# Patient Record
Sex: Female | Born: 1985
Health system: Southern US, Community
[De-identification: ages and names within clinical notes are randomized; demographics above are authoritative.]

## PROBLEM LIST (undated history)

## (undated) DIAGNOSIS — J45909 Unspecified asthma, uncomplicated: Secondary | ICD-10-CM

## (undated) DIAGNOSIS — O409XX0 Polyhydramnios, unspecified trimester, not applicable or unspecified: Secondary | ICD-10-CM

## (undated) DIAGNOSIS — E669 Obesity, unspecified: Secondary | ICD-10-CM

## (undated) DIAGNOSIS — G43909 Migraine, unspecified, not intractable, without status migrainosus: Secondary | ICD-10-CM

## (undated) HISTORY — DX: Migraine, unspecified, not intractable, without status migrainosus: G43.909

## (undated) HISTORY — DX: Unspecified asthma, uncomplicated: J45.909

---

## 2015-02-10 HISTORY — PX: OVARIAN CYST REMOVAL: SHX89

## 2015-03-07 DIAGNOSIS — Z8669 Personal history of other diseases of the nervous system and sense organs: Secondary | ICD-10-CM | POA: Insufficient documentation

## 2016-02-10 HISTORY — PX: DILATION AND CURETTAGE OF UTERUS: SHX78

## 2017-11-16 ENCOUNTER — Ambulatory Visit (INDEPENDENT_AMBULATORY_CARE_PROVIDER_SITE_OTHER): Payer: BLUE CROSS/BLUE SHIELD | Admitting: Obstetrics and Gynecology

## 2017-11-16 ENCOUNTER — Encounter: Payer: Self-pay | Admitting: Obstetrics and Gynecology

## 2017-11-16 VITALS — BP 136/82 | HR 89 | Ht 65.0 in | Wt 242.0 lb

## 2017-11-16 DIAGNOSIS — Z3200 Encounter for pregnancy test, result unknown: Secondary | ICD-10-CM | POA: Diagnosis not present

## 2017-11-16 LAB — POCT URINE PREGNANCY: Preg Test, Ur: POSITIVE — AB

## 2017-11-16 NOTE — Progress Notes (Signed)
HPI:      [name] is a 32 y.o. G1P0 who LMP was Patient's last menstrual period was 10/05/2017 (exact date).  Subjective:   She presents today for pregnancy confirmation.  Her last menstrual period was in August.  She and her husband have been trying for almost a full year to become pregnant. Her last pregnancy ended in a miscarriage for anembryonic gestation. Patient's medical history significant for sickle cell trait.  She states her husband has been specifically tested and is negative for sickle cell disease or trait. Patient is currently taking prenatal vitamins.    Hx: The following portions of the patient's history were reviewed and updated as appropriate:             She  has a past medical history of Asthma and Migraines. She does not have a problem list on file. She  has a past surgical history that includes Ovarian cyst removal (2017) and Dilation and curettage of uterus (2018). Her family history includes Diabetes in her maternal grandmother; Hyperlipidemia in her father. She  reports that she has never smoked. She has never used smokeless tobacco. She reports that she does not drink alcohol or use drugs. She has a current medication list which includes the following prescription(s): prenatal multivitamin and sumatriptan. She has no allergies on file.       Review of Systems:  Review of Systems  Constitutional: Denied constitutional symptoms, night sweats, recent illness, fatigue, fever, insomnia and weight loss.  Eyes: Denied eye symptoms, eye pain, photophobia, vision change and visual disturbance.  Ears/Nose/Throat/Neck: Denied ear, nose, throat or neck symptoms, hearing loss, nasal discharge, sinus congestion and sore throat.  Cardiovascular: Denied cardiovascular symptoms, arrhythmia, chest pain/pressure, edema, exercise intolerance, orthopnea and palpitations.  Respiratory: Denied pulmonary symptoms, asthma, pleuritic pain, productive sputum, cough, dyspnea and  wheezing.  Gastrointestinal: Denied, gastro-esophageal reflux, melena, nausea and vomiting.  Genitourinary: Denied genitourinary symptoms including symptomatic vaginal discharge, pelvic relaxation issues, and urinary complaints.  Musculoskeletal: Denied musculoskeletal symptoms, stiffness, swelling, muscle weakness and myalgia.  Dermatologic: Denied dermatology symptoms, rash and scar.  Neurologic: Denied neurology symptoms, dizziness, headache, neck pain and syncope.  Psychiatric: Denied psychiatric symptoms, anxiety and depression.  Endocrine: Denied endocrine symptoms including hot flashes and night sweats.   Meds:   Current Outpatient Medications on File Prior to Visit  Medication Sig Dispense Refill  . Prenatal Vit-Fe Fumarate-FA (PRENATAL MULTIVITAMIN) TABS tablet Take 1 tablet by mouth daily at 12 noon.    . SUMAtriptan (IMITREX) 50 MG tablet Take 50 mg by mouth as needed for migraine. May repeat in 2 hours if headache persists or recurs.     No current facility-administered medications on file prior to visit.     Objective:     Vitals:   11/16/17 1353  BP: 136/82  Pulse: 89              UPC positive  Assessment:    G1P0 There are no active problems to display for this patient.    1. Possible pregnancy     Based on LMP approximately [redacted] weeks gestation.   Plan:              Prenatal Plan 1.  The patient was given prenatal literature. 2.  She was continued on prenatal vitamins. 3.  A prenatal lab panel was ordered or drawn. 4.  An ultrasound was ordered to better determine an EDC. 5.  A nurse visit was scheduled. 6.  Genetic  testing and testing for other inheritable conditions discussed in detail. She will decide in the future whether to have these labs performed. 7.  A general overview of pregnancy testing, visit schedule, ultrasound schedule, and prenatal care was discussed.   Orders Orders Placed This Encounter  Procedures  . US OB Comp Less 14 Wks  .  POCT urine pregnancy    No orders of the defined types were placed in this encounter.     F/U  Return in about 5 weeks (around 12/21/2017). I spent 15 minutes involved in the care of this patient of which greater than 50% was spent discussing pregnancy, prenatal care, genetic testing, sickle cell trait and disease, all questions answered.  Elonda Husky, M.D. 11/16/2017 2:35 PM

## 2017-11-16 NOTE — Progress Notes (Signed)
Pt presents today for pregnancy confirmation. LMP  10/05/17 Pt and her husband have been try to conceive for the past year since a miscarriage and D&C in June 2018.

## 2017-11-17 ENCOUNTER — Ambulatory Visit (INDEPENDENT_AMBULATORY_CARE_PROVIDER_SITE_OTHER): Payer: BLUE CROSS/BLUE SHIELD

## 2017-11-17 DIAGNOSIS — Z3A01 Less than 8 weeks gestation of pregnancy: Secondary | ICD-10-CM | POA: Diagnosis not present

## 2017-11-17 DIAGNOSIS — O3680X0 Pregnancy with inconclusive fetal viability, not applicable or unspecified: Secondary | ICD-10-CM | POA: Diagnosis not present

## 2017-11-17 DIAGNOSIS — Z3200 Encounter for pregnancy test, result unknown: Secondary | ICD-10-CM

## 2017-11-18 ENCOUNTER — Other Ambulatory Visit: Payer: Self-pay | Admitting: Obstetrics and Gynecology

## 2017-11-18 DIAGNOSIS — O3680X0 Pregnancy with inconclusive fetal viability, not applicable or unspecified: Secondary | ICD-10-CM

## 2017-12-01 ENCOUNTER — Ambulatory Visit (INDEPENDENT_AMBULATORY_CARE_PROVIDER_SITE_OTHER): Payer: BLUE CROSS/BLUE SHIELD

## 2017-12-01 DIAGNOSIS — O3680X0 Pregnancy with inconclusive fetal viability, not applicable or unspecified: Secondary | ICD-10-CM | POA: Diagnosis not present

## 2017-12-01 DIAGNOSIS — Z3A01 Less than 8 weeks gestation of pregnancy: Secondary | ICD-10-CM

## 2017-12-01 DIAGNOSIS — N8312 Corpus luteum cyst of left ovary: Secondary | ICD-10-CM | POA: Diagnosis not present

## 2017-12-01 DIAGNOSIS — O3411 Maternal care for benign tumor of corpus uteri, first trimester: Secondary | ICD-10-CM | POA: Diagnosis not present

## 2017-12-01 NOTE — Progress Notes (Unsigned)
      Lurline Hare presents for NOB nurse intake visit. Pregnancy confirmation done at [facility] 11/16/17 with DJE.  G1 P 0 LMP 10/05/2017. 12/01/17 U/S 7/6 EDD 07/14/18  Pregnancy education material explained and given. 0 cats in the home.  NOB labs ordered. BMI greater than 30. TSH/HbgA1c. Sickle cell order due to race. HIV and drug screen explained and ordered. Genetic screening discussed.  Do to increased BMI patient will have at next visit. PNV encouraged. Pt to follow up with provider in 4 weeks for NOB physical. [employer] and [employer] reviewed and explained to pt. Pt stated that she understood.   BP 131/68   Pulse 94   Ht 5\' 5"  (1.651 m)   Wt 239 lb 4.8 oz (108.5 kg)   LMP 10/05/2017 (Exact Date)   BMI 39.82 kg/m

## 2017-12-03 ENCOUNTER — Ambulatory Visit: Payer: BLUE CROSS/BLUE SHIELD | Admitting: Obstetrics and Gynecology

## 2017-12-03 VITALS — BP 131/68 | HR 94 | Ht 65.0 in | Wt 239.3 lb

## 2017-12-03 DIAGNOSIS — Z0283 Encounter for blood-alcohol and blood-drug test: Secondary | ICD-10-CM

## 2017-12-03 DIAGNOSIS — Z113 Encounter for screening for infections with a predominantly sexual mode of transmission: Secondary | ICD-10-CM

## 2017-12-03 DIAGNOSIS — Z3401 Encounter for supervision of normal first pregnancy, first trimester: Secondary | ICD-10-CM

## 2017-12-03 NOTE — Patient Instructions (Signed)
How a Baby Grows During Pregnancy Pregnancy begins when a female's sperm enters a female's egg (fertilization). This happens in one of the tubes (fallopian tubes) that connect the ovaries to the womb (uterus). The fertilized egg is called an embryo until it reaches 10 weeks. From 10 weeks until birth, it is called a fetus. The fertilized egg moves down the fallopian tube to the uterus. Then it implants into the lining of the uterus and begins to grow. The developing fetus receives oxygen and nutrients through the pregnant woman's bloodstream and the tissues that grow (placenta) to support the fetus. The placenta is the life support system for the fetus. It provides nutrition and removes waste. Learning as much as you can about your pregnancy and how your baby is developing can help you enjoy the experience. It can also make you aware of when there might be a problem and when to ask questions. How long does a typical pregnancy last? A pregnancy usually lasts 280 days, or about 40 weeks. Pregnancy is divided into three trimesters:  First trimester: 0-13 weeks.  Second trimester: 14-27 weeks.  Third trimester: 28-40 weeks.  The day when your baby is considered ready to be born (full term) is your estimated date of delivery. How does my baby develop month by month? First month  The fertilized egg attaches to the inside of the uterus.  Some cells will form the placenta. Others will form the fetus.  The arms, legs, brain, spinal cord, lungs, and heart begin to develop.  At the end of the first month, the heart begins to beat.  Second month  The bones, inner ear, eyelids, hands, and feet form.  The genitals develop.  By the end of 8 weeks, all major organs are developing.  Third month  All of the internal organs are forming.  Teeth develop below the gums.  Bones and muscles begin to grow. The spine can flex.  The skin is transparent.  Fingernails and toenails begin to form.  Arms  and legs continue to grow longer, and hands and feet develop.  The fetus is about 3 in (7.6 cm) long.  Fourth month  The placenta is completely formed.  The external sex organs, neck, outer ear, eyebrows, eyelids, and fingernails are formed.  The fetus can hear, swallow, and move its arms and legs.  The kidneys begin to produce urine.  The skin is covered with a white waxy coating (vernix) and very fine hair (lanugo).  Fifth month  The fetus moves around more and can be felt for the first time (quickening).  The fetus starts to sleep and wake up and may begin to suck its finger.  The nails grow to the end of the fingers.  The organ in the digestive system that makes bile (gallbladder) functions and helps to digest the nutrients.  If your baby is a girl, eggs are present in her ovaries. If your baby is a boy, testicles start to move down into his scrotum.  Sixth month  The lungs are formed, but the fetus is not yet able to breathe.  The eyes open. The brain continues to develop.  Your baby has fingerprints and toe prints. Your baby's hair grows thicker.  At the end of the second trimester, the fetus is about 9 in (22.9 cm) long.  Seventh month  The fetus kicks and stretches.  The eyes are developed enough to sense changes in light.  The hands can make a grasping motion.  The   fetus responds to sound.  Eighth month  All organs and body systems are fully developed and functioning.  Bones harden and taste buds develop. The fetus may hiccup.  Certain areas of the brain are still developing. The skull remains soft.  Ninth month  The fetus gains about  lb (0.23 kg) each week.  The lungs are fully developed.  Patterns of sleep develop.  The fetus's head typically moves into a head-down position (vertex) in the uterus to prepare for birth. If the buttocks move into a vertex position instead, the baby is breech.  The fetus weighs 6-9 lbs (2.72-4.08 kg) and is  19-20 in (48.26-50.8 cm) long.  What can I do to have a healthy pregnancy and help my baby develop? Eating and Drinking  Eat a healthy diet. ? Talk with your health care provider to make sure that you are getting the nutrients that you and your baby need. ? Visit www.choosemyplate.gov to learn about creating a healthy diet.  Gain a healthy amount of weight during pregnancy as advised by your health care provider. This is usually 25-35 pounds. You may need to: ? Gain more if you were underweight before getting pregnant or if you are pregnant with more than one baby. ? Gain less if you were overweight or obese when you got pregnant.  Medicines and Vitamins  Take prenatal vitamins as directed by your health care provider. These include vitamins such as folic acid, iron, calcium, and vitamin D. They are important for healthy development.  Take medicines only as directed by your health care provider. Read labels and ask a pharmacist or your health care provider whether over-the-counter medicines, supplements, and prescription drugs are safe to take during pregnancy.  Activities  Be physically active as advised by your health care provider. Ask your health care provider to recommend activities that are safe for you to do, such as walking or swimming.  Do not participate in strenuous or extreme sports.  Lifestyle  Do not drink alcohol.  Do not use any tobacco products, including cigarettes, chewing tobacco, or electronic cigarettes. If you need help quitting, ask your health care provider.  Do not use illegal drugs.  Safety  Avoid exposure to mercury, lead, or other heavy metals. Ask your health care provider about common sources of these heavy metals.  Avoid listeria infection during pregnancy. Follow these precautions: ? Do not eat soft cheeses or deli meats. ? Do not eat hot dogs unless they have been warmed up to the point of steaming, such as in the microwave oven. ? Do not  drink unpasteurized milk.  Avoid toxoplasmosis infection during pregnancy. Follow these precautions: ? Do not change your cat's litter box, if you have a cat. Ask someone else to do this for you. ? Wear gardening gloves while working in the yard.  General Instructions  Keep all follow-up visits as directed by your health care provider. This is important. This includes prenatal care and screening tests.  Manage any chronic health conditions. Work closely with your health care provider to keep conditions, such as diabetes, under control.  How do I know if my baby is developing well? At each prenatal visit, your health care provider will do several different tests to check on your health and keep track of your baby's development. These include:  Fundal height. ? Your health care provider will measure your growing belly from top to bottom using a tape measure. ? Your health care provider will also feel your belly   to determine your baby's position.  Heartbeat. ? An ultrasound in the first trimester can confirm pregnancy and show a heartbeat, depending on how far along you are. ? Your health care provider will check your baby's heart rate at every prenatal visit. ? As you get closer to your delivery date, you may have regular fetal heart rate monitoring to make sure that your baby is not in distress.  Second trimester ultrasound. ? This ultrasound checks your baby's development. It also indicates your baby's gender.  What should I do if I have concerns about my baby's development? Always talk with your health care provider about any concerns that you may have. This information is not intended to replace advice given to you by your health care provider. Make sure you discuss any questions you have with your health care provider. Document Released: 07/15/2007 Document Revised: 07/04/2015 Document Reviewed: 07/05/2013 Elsevier Interactive Patient Education  2018 [employer]  First  Trimester of Pregnancy The first trimester of pregnancy is from week 1 until the end of week 13 (months 1 through 3). A week after a sperm fertilizes an egg, the egg will implant on the wall of the uterus. This embryo will begin to develop into a baby. Genes from you and your partner will form the baby. The female genes will determine whether the baby will be a boy or a girl. At 6-8 weeks, the eyes and face will be formed, and the heartbeat can be seen on ultrasound. At the end of 12 weeks, all the baby's organs will be formed. Now that you are pregnant, you will want to do everything you can to have a healthy baby. Two of the most important things are to get good prenatal care and to follow your health care provider's instructions. Prenatal care is all the medical care you receive before the baby's birth. This care will help prevent, find, and treat any problems during the pregnancy and childbirth. Body changes during your first trimester Your body goes through many changes during pregnancy. The changes vary from woman to woman.  You may gain or lose a couple of pounds at first.  You may feel sick to your stomach (nauseous) and you may throw up (vomit). If the vomiting is uncontrollable, call your health care provider.  You may tire easily.  You may develop headaches that can be relieved by medicines. All medicines should be approved by your health care provider.  You may urinate more often. Painful urination may mean you have a bladder infection.  You may develop heartburn as a result of your pregnancy.  You may develop constipation because certain hormones are causing the muscles that push stool through your intestines to slow down.  You may develop hemorrhoids or swollen veins (varicose veins).  Your breasts may begin to grow larger and become tender. Your nipples may stick out more, and the tissue that surrounds them (areola) may become darker.  Your gums may bleed and may be sensitive to  brushing and flossing.  Dark spots or blotches (chloasma, mask of pregnancy) may develop on your face. This will likely fade after the baby is born.  Your menstrual periods will stop.  You may have a loss of appetite.  You may develop cravings for certain kinds of food.  You may have changes in your emotions from day to day, such as being excited to be pregnant or being concerned that something may go wrong with the pregnancy and baby.  You may have more   vivid and strange dreams.  You may have changes in your hair. These can include thickening of your hair, rapid growth, and changes in texture. Some women also have hair loss during or after pregnancy, or hair that feels dry or thin. Your hair will most likely return to normal after your baby is born.  What to expect at prenatal visits During a routine prenatal visit:  You will be weighed to make sure you and the baby are growing normally.  Your blood pressure will be taken.  Your abdomen will be measured to track your baby's growth.  The fetal heartbeat will be listened to between weeks 10 and 14 of your pregnancy.  Test results from any previous visits will be discussed.  Your health care provider may ask you:  How you are feeling.  If you are feeling the baby move.  If you have had any abnormal symptoms, such as leaking fluid, bleeding, severe headaches, or abdominal cramping.  If you are using any tobacco products, including cigarettes, chewing tobacco, and electronic cigarettes.  If you have any questions.  Other tests that may be performed during your first trimester include:  Blood tests to find your blood type and to check for the presence of any previous infections. The tests will also be used to check for low iron levels (anemia) and protein on red blood cells (Rh antibodies). Depending on your risk factors, or if you previously had diabetes during pregnancy, you may have tests to check for high blood sugar that  affects pregnant women (gestational diabetes).  Urine tests to check for infections, diabetes, or protein in the urine.  An ultrasound to confirm the proper growth and development of the baby.  Fetal screens for spinal cord problems (spina bifida) and Down syndrome.  HIV (human immunodeficiency virus) testing. Routine prenatal testing includes screening for HIV, unless you choose not to have this test.  You may need other tests to make sure you and the baby are doing well.  Follow these instructions at home: Medicines  Follow your health care provider's instructions regarding medicine use. Specific medicines may be either safe or unsafe to take during pregnancy.  Take a prenatal vitamin that contains at least 600 micrograms (mcg) of folic acid.  If you develop constipation, try taking a stool softener if your health care provider approves. Eating and drinking  Eat a balanced diet that includes fresh fruits and vegetables, whole grains, good sources of protein such as meat, eggs, or tofu, and low-fat dairy. Your health care provider will help you determine the amount of weight gain that is right for you.  Avoid raw meat and uncooked cheese. These carry germs that can cause birth defects in the baby.  Eating four or five small meals rather than three large meals a day may help relieve nausea and vomiting. If you start to feel nauseous, eating a few soda crackers can be helpful. Drinking liquids between meals, instead of during meals, also seems to help ease nausea and vomiting.  Limit foods that are high in fat and processed sugars, such as fried and sweet foods.  To prevent constipation: ? Eat foods that are high in fiber, such as fresh fruits and vegetables, whole grains, and beans. ? Drink enough fluid to keep your urine clear or pale yellow. Activity  Exercise only as directed by your health care provider. Most women can continue their usual exercise routine during pregnancy. Try  to exercise for 30 minutes at least 5 days   a week. Exercising will help you: ? Control your weight. ? Stay in shape. ? Be prepared for labor and delivery.  Experiencing pain or cramping in the lower abdomen or lower back is a good sign that you should stop exercising. Check with your health care provider before continuing with normal exercises.  Try to avoid standing for long periods of time. Move your legs often if you must stand in one place for a long time.  Avoid heavy lifting.  Wear low-heeled shoes and practice good posture.  You may continue to have sex unless your health care provider tells you not to. Relieving pain and discomfort  Wear a good support bra to relieve breast tenderness.  Take warm sitz baths to soothe any pain or discomfort caused by hemorrhoids. Use hemorrhoid cream if your health care provider approves.  Rest with your legs elevated if you have leg cramps or low back pain.  If you develop varicose veins in your legs, wear support hose. Elevate your feet for 15 minutes, 3-4 times a day. Limit salt in your diet. Prenatal care  Schedule your prenatal visits by the twelfth week of pregnancy. They are usually scheduled monthly at first, then more often in the last 2 months before delivery.  Write down your questions. Take them to your prenatal visits.  Keep all your prenatal visits as told by your health care provider. This is important. Safety  Wear your seat belt at all times when driving.  Make a list of emergency phone numbers, including numbers for family, friends, the hospital, and police and fire departments. General instructions  Ask your health care provider for a referral to a local prenatal education class. Begin classes no later than the beginning of month 6 of your pregnancy.  Ask for help if you have counseling or nutritional needs during pregnancy. Your health care provider can offer advice or refer you to specialists for help with various  needs.  Do not use hot tubs, steam rooms, or saunas.  Do not douche or use tampons or scented sanitary pads.  Do not cross your legs for long periods of time.  Avoid cat litter boxes and soil used by cats. These carry germs that can cause birth defects in the baby and possibly loss of the fetus by miscarriage or stillbirth.  Avoid all smoking, herbs, alcohol, and medicines not prescribed by your health care provider. Chemicals in these products affect the formation and growth of the baby.  Do not use any products that contain nicotine or tobacco, such as cigarettes and e-cigarettes. If you need help quitting, ask your health care provider. You may receive counseling support and other resources to help you quit.  Schedule a dentist appointment. At home, brush your teeth with a soft toothbrush and be gentle when you floss. Contact a health care provider if:  You have dizziness.  You have mild pelvic cramps, pelvic pressure, or nagging pain in the abdominal area.  You have persistent nausea, vomiting, or diarrhea.  You have a bad smelling vaginal discharge.  You have pain when you urinate.  You notice increased swelling in your face, hands, legs, or ankles.  You are exposed to fifth disease or chickenpox.  You are exposed to German measles (rubella) and have never had it. Get help right away if:  You have a fever.  You are leaking fluid from your vagina.  You have spotting or bleeding from your vagina.  You have severe abdominal cramping or pain.    You have rapid weight gain or loss.  You vomit blood or material that looks like coffee grounds.  You develop a severe headache.  You have shortness of breath.  You have any kind of trauma, such as from a fall or a car accident. Summary  The first trimester of pregnancy is from week 1 until the end of week 13 (months 1 through 3).  Your body goes through many changes during pregnancy. The changes vary from woman to  woman.  You will have routine prenatal visits. During those visits, your health care provider will examine you, discuss any test results you may have, and talk with you about how you are feeling. This information is not intended to replace advice given to you by your health care provider. Make sure you discuss any questions you have with your health care provider. Document Released: 01/20/2001 Document Revised: 01/08/2016 Document Reviewed: 01/08/2016 Elsevier Interactive Patient Education  2018 [employer]  Commonly Asked Questions During Pregnancy  Cats: A parasite can be excreted in cat feces.  To avoid exposure you need to have another person empty the little box.  If you must empty the litter box you will need to wear gloves.  Wash your hands after handling your cat.  This parasite can also be found in raw or undercooked meat so this should also be avoided.  Colds, Sore Throats, Flu: Please check your medication sheet to see what you can take for symptoms.  If your symptoms are unrelieved by these medications please call the office.  Dental Work: Most any dental work your dentist recommends is permitted.  X-rays should only be taken during the first trimester if absolutely necessary.  Your abdomen should be shielded with a lead apron during all x-rays.  Please notify your provider prior to receiving any x-rays.  Novocaine is fine; gas is not recommended.  If your dentist requires a note from us prior to dental work please call the office and we will provide one for you.  Exercise: Exercise is an important part of staying healthy during your pregnancy.  You may continue most exercises you were accustomed to prior to pregnancy.  Later in your pregnancy you will most likely notice you have difficulty with activities requiring balance like riding a bicycle.  It is important that you listen to your body and avoid activities that put you at a higher risk of falling.  Adequate rest and staying well  hydrated are a must!  If you have questions about the safety of specific activities ask your provider.    Exposure to Children with illness: Try to avoid obvious exposure; report any symptoms to us when noted,  If you have chicken pos, red measles or mumps, you should be immune to these diseases.   Please do not take any vaccines while pregnant unless you have checked with your OB provider.  Fetal Movement: After 28 weeks we recommend you do "kick counts" twice daily.  Lie or sit down in a calm quiet environment and count your baby movements "kicks".  You should feel your baby at least 10 times per hour.  If you have not felt 10 kicks within the first hour get up, walk around and have something sweet to eat or drink then repeat for an additional hour.  If count remains less than 10 per hour notify your provider.  Fumigating: Follow your pest control agent's advice as to how long to stay out of your home.  Ventilate the area well   before re-entering.  Hemorrhoids:   Most over-the-counter preparations can be used during pregnancy.  Check your medication to see what is safe to use.  It is important to use a stool softener or fiber in your diet and to drink lots of liquids.  If hemorrhoids seem to be getting worse please call the office.   Hot Tubs:  Hot tubs Jacuzzis and saunas are not recommended while pregnant.  These increase your internal body temperature and should be avoided.  Intercourse:  Sexual intercourse is safe during pregnancy as long as you are comfortable, unless otherwise advised by your provider.  Spotting may occur after intercourse; report any bright red bleeding that is heavier than spotting.  Labor:  If you know that you are in labor, please go to the hospital.  If you are unsure, please call the office and let us help you decide what to do.  Lifting, straining, etc:  If your job requires heavy lifting or straining please check with your provider for any limitations.  Generally, you  should not lift items heavier than that you can lift simply with your hands and arms (no back muscles)  Painting:  Paint fumes do not harm your pregnancy, but may make you ill and should be avoided if possible.  Latex or water based paints have less odor than oils.  Use adequate ventilation while painting.  Permanents & Hair Color:  Chemicals in hair dyes are not recommended as they cause increase hair dryness which can increase hair loss during pregnancy.  " Highlighting" and permanents are allowed.  Dye may be absorbed differently and permanents may not hold as well during pregnancy.  Sunbathing:  Use a sunscreen, as skin burns easily during pregnancy.  Drink plenty of fluids; avoid over heating.  Tanning Beds:  Because their possible side effects are still unknown, tanning beds are not recommended.  Ultrasound Scans:  Routine ultrasounds are performed at approximately 20 weeks.  You will be able to see your baby's general anatomy an if you would like to know the gender this can usually be determined as well.  If it is questionable when you conceived you may also receive an ultrasound early in your pregnancy for dating purposes.  Otherwise ultrasound exams are not routinely performed unless there is a medical necessity.  Although you can request a scan we ask that you pay for it when conducted because insurance does not cover " patient request" scans.  Work: If your pregnancy proceeds without complications you may work until your due date, unless your physician or employer advises otherwise.  Round Ligament Pain/Pelvic Discomfort:  Sharp, shooting pains not associated with bleeding are fairly common, usually occurring in the second trimester of pregnancy.  They tend to be worse when standing up or when you remain standing for long periods of time.  These are the result of pressure of certain pelvic ligaments called "round ligaments".  Rest, Tylenol and heat seem to be the most effective relief.  As  the womb and fetus grow, they rise out of the pelvis and the discomfort improves.  Please notify the office if your pain seems different than that described.  It may represent a more serious condition.   

## 2017-12-04 LAB — URINALYSIS, ROUTINE W REFLEX MICROSCOPIC
Bilirubin, UA: NEGATIVE
Glucose, UA: NEGATIVE
Ketones, UA: NEGATIVE
Nitrite, UA: NEGATIVE
PH UA: 6 (ref 5.0–7.5)
Protein, UA: NEGATIVE
RBC, UA: NEGATIVE
SPEC GRAV UA: 1.015 (ref 1.005–1.030)
Urobilinogen, Ur: 0.2 mg/dL (ref 0.2–1.0)

## 2017-12-04 LAB — GC/CHLAMYDIA PROBE AMP
Chlamydia trachomatis, NAA: NEGATIVE
Neisseria gonorrhoeae by PCR: NEGATIVE

## 2017-12-04 LAB — MICROSCOPIC EXAMINATION: CASTS: NONE SEEN /LPF

## 2017-12-05 LAB — URINE CULTURE

## 2017-12-06 LAB — MONITOR DRUG PROFILE 14(MW)
Amphetamine Scrn, Ur: NEGATIVE ng/mL
BARBITURATE SCREEN URINE: NEGATIVE ng/mL
BENZODIAZEPINE SCREEN, URINE: NEGATIVE ng/mL
Buprenorphine, Urine: NEGATIVE ng/mL
CANNABINOIDS UR QL SCN: NEGATIVE ng/mL
Cocaine (Metab) Scrn, Ur: NEGATIVE ng/mL
Creatinine(Crt), U: 144.1 mg/dL (ref 20.0–300.0)
Fentanyl, Urine: NEGATIVE pg/mL
MEPERIDINE SCREEN, URINE: NEGATIVE ng/mL
Methadone Screen, Urine: NEGATIVE ng/mL
OXYCODONE+OXYMORPHONE UR QL SCN: NEGATIVE ng/mL
Opiate Scrn, Ur: NEGATIVE ng/mL
PH UR, DRUG SCRN: 6.1 (ref 4.5–8.9)
Phencyclidine Qn, Ur: NEGATIVE ng/mL
Propoxyphene Scrn, Ur: NEGATIVE ng/mL
SPECIFIC GRAVITY: 1.013
Tramadol Screen, Urine: NEGATIVE ng/mL

## 2017-12-06 LAB — NICOTINE SCREEN, URINE: COTININE UR QL SCN: NEGATIVE ng/mL

## 2017-12-07 LAB — TSH: TSH: 1.04 u[IU]/mL (ref 0.450–4.500)

## 2017-12-07 LAB — ANTIBODY SCREEN: Antibody Screen: NEGATIVE

## 2017-12-07 LAB — HGB SOLU + RFLX FRAC: Sickle Solubility Test - HGBRFX: POSITIVE — AB

## 2017-12-07 LAB — HIV ANTIBODY (ROUTINE TESTING W REFLEX): HIV SCREEN 4TH GENERATION: NONREACTIVE

## 2017-12-07 LAB — ABO AND RH: Rh Factor: POSITIVE

## 2017-12-07 LAB — HEMOGLOBIN FRAC.W/O SOLUBILITY
HEMOGLOBIN A2 QUANTITATION: 4 % — AB (ref 1.8–3.2)
HGB C: 0 %
HGB S: 37.5 % — ABNORMAL HIGH
HGB VARIANT: 0 %
Hemoglobin F Quantitation: 0 % (ref 0.0–2.0)
Hgb A: 58.5 % — ABNORMAL LOW (ref 96.4–98.8)

## 2017-12-07 LAB — RUBELLA SCREEN: Rubella Antibodies, IGG: 3.52 index (ref >0.99)

## 2017-12-07 LAB — SICKLE CELL SCREEN: SICKLE CELL SCREEN: POSITIVE — AB

## 2017-12-07 LAB — HEMOGLOBIN A1C
Est. average glucose Bld gHb Est-mCnc: 103 mg/dL
Hgb A1c MFr Bld: 5.2 % (ref 4.8–5.6)

## 2017-12-07 LAB — RPR: RPR: NONREACTIVE

## 2017-12-07 LAB — HEPATITIS B SURFACE ANTIGEN: Hepatitis B Surface Ag: NEGATIVE

## 2017-12-07 LAB — VARICELLA ZOSTER ANTIBODY, IGG: VARICELLA: 1558 {index} (ref >165)

## 2017-12-14 ENCOUNTER — Telehealth: Payer: Self-pay

## 2017-12-14 NOTE — Telephone Encounter (Signed)
Left VM stating that Pt's lab work was normal and tests GC/CT was negative.

## 2017-12-21 ENCOUNTER — Encounter: Payer: Self-pay | Admitting: Obstetrics and Gynecology

## 2017-12-21 ENCOUNTER — Other Ambulatory Visit (HOSPITAL_COMMUNITY)
Admission: RE | Admit: 2017-12-21 | Discharge: 2017-12-21 | Disposition: A | Discharge status: Home / Self Care | Payer: BLUE CROSS/BLUE SHIELD | Source: Ambulatory Visit | Attending: Obstetrics and Gynecology | Admitting: Obstetrics and Gynecology

## 2017-12-21 ENCOUNTER — Ambulatory Visit (INDEPENDENT_AMBULATORY_CARE_PROVIDER_SITE_OTHER): Payer: BLUE CROSS/BLUE SHIELD | Admitting: Obstetrics and Gynecology

## 2017-12-21 VITALS — BP 112/79 | HR 88 | Wt 243.0 lb

## 2017-12-21 DIAGNOSIS — Z3A12 12 weeks gestation of pregnancy: Secondary | ICD-10-CM

## 2017-12-21 DIAGNOSIS — E6609 Other obesity due to excess calories: Secondary | ICD-10-CM

## 2017-12-21 DIAGNOSIS — Z6839 Body mass index (BMI) 39.0-39.9, adult: Secondary | ICD-10-CM

## 2017-12-21 DIAGNOSIS — Z3401 Encounter for supervision of normal first pregnancy, first trimester: Secondary | ICD-10-CM

## 2017-12-21 DIAGNOSIS — O99211 Obesity complicating pregnancy, first trimester: Secondary | ICD-10-CM

## 2017-12-21 LAB — POCT URINALYSIS DIPSTICK OB
BILIRUBIN UA: NEGATIVE
Blood, UA: NEGATIVE
GLUCOSE, UA: NEGATIVE
Ketones, UA: NEGATIVE
Leukocytes, UA: NEGATIVE
Nitrite, UA: NEGATIVE
POC,PROTEIN,UA: NEGATIVE
Spec Grav, UA: 1.01 (ref 1.010–1.025)
Urobilinogen, UA: 0.2 E.U./dL
pH, UA: 7 (ref 5.0–8.0)

## 2017-12-21 NOTE — Addendum Note (Signed)
Addended by: Haskel KhanSMITH, Charlyne Robertshaw M on: 12/21/2017 10:22 AM   Modules accepted: Orders

## 2017-12-21 NOTE — Progress Notes (Signed)
Pt presents today for NOB PE. Pt states she has had mild morning sickness but she is not concerned with it. Pt has no concerns. Pt last pap was 2-3 years ago and results were normal.

## 2017-12-21 NOTE — Progress Notes (Signed)
NOB: Patient has no complaints.  She is taking prenatal vitamins as directed.  We have further discussed genetic testing and she desires quad screen but declines cell free DNA.  Patient does not want to know the sex. Physical examination General NAD, Conversant  HEENT Atraumatic; Op clear with mmm.  Normo-cephalic. Pupils reactive. Anicteric sclerae  Thyroid/Neck Smooth without nodularity or enlargement. Normal ROM.  Neck Supple.  Skin No rashes, lesions or ulceration. Normal palpated skin turgor. No nodularity.  Breasts: No masses or discharge.  Symmetric.  No axillary adenopathy.  Lungs: Clear to auscultation.No rales or wheezes. Normal Respiratory effort, no retractions.  Heart: NSR.  No murmurs or rubs appreciated. No periferal edema  Abdomen: Soft.  Non-tender.  No masses.  No HSM. No hernia  Extremities: Moves all appropriately.  Normal ROM for age. No lymphadenopathy.  Neuro: Oriented to PPT.  Normal mood. Normal affect.     Pelvic:   Vulva: Normal appearance.  No lesions.  Vagina: No lesions or abnormalities noted.  Support: Normal pelvic support.  Urethra No masses tenderness or scarring.  Meatus Normal size without lesions or prolapse.  Cervix: Normal appearance.  No lesions.  Anus: Normal exam.  No lesions.  Perineum: Normal exam.  No lesions.        Bimanual   Adnexae: No masses.  Non-tender to palpation.  Uterus: Enlarged. 12wks  Non-tender.  Mobile.  AV.  Adnexae: No masses.  Non-tender to palpation.  Cul-de-sac: Negative for abnormality.  Adnexae: No masses.  Non-tender to palpation.         Pelvimetry   Diagonal: Reached.  Spines: Average.  Sacrum: Concave.  Pubic Arch: Normal.   Exam limited by patient body habitus Pap GC/CT performed

## 2017-12-21 NOTE — Addendum Note (Signed)
Addended by: Haskel Khan on: 12/21/2017 10:49 AM   Modules accepted: Orders

## 2017-12-24 LAB — CYTOLOGY - PAP
CHLAMYDIA, DNA PROBE: NEGATIVE
Diagnosis: NEGATIVE
HPV: NOT DETECTED
NEISSERIA GONORRHEA: NEGATIVE

## 2018-01-19 ENCOUNTER — Ambulatory Visit (INDEPENDENT_AMBULATORY_CARE_PROVIDER_SITE_OTHER): Payer: BLUE CROSS/BLUE SHIELD | Admitting: Obstetrics and Gynecology

## 2018-01-19 VITALS — BP 126/78 | HR 94 | Wt 250.3 lb

## 2018-01-19 DIAGNOSIS — O9989 Other specified diseases and conditions complicating pregnancy, childbirth and the puerperium: Secondary | ICD-10-CM

## 2018-01-19 DIAGNOSIS — O09292 Supervision of pregnancy with other poor reproductive or obstetric history, second trimester: Secondary | ICD-10-CM | POA: Insufficient documentation

## 2018-01-19 DIAGNOSIS — Z3482 Encounter for supervision of other normal pregnancy, second trimester: Secondary | ICD-10-CM

## 2018-01-19 DIAGNOSIS — Z3490 Encounter for supervision of normal pregnancy, unspecified, unspecified trimester: Secondary | ICD-10-CM | POA: Insufficient documentation

## 2018-01-19 DIAGNOSIS — D573 Sickle-cell trait: Secondary | ICD-10-CM

## 2018-01-19 LAB — POCT URINALYSIS DIPSTICK OB
BILIRUBIN UA: NEGATIVE
Blood, UA: NEGATIVE
Glucose, UA: NEGATIVE
Ketones, UA: NEGATIVE
Leukocytes, UA: NEGATIVE
Nitrite, UA: NEGATIVE
PH UA: 6.5 (ref 5.0–8.0)
POC,PROTEIN,UA: NEGATIVE
Spec Grav, UA: 1.015 (ref 1.010–1.025)
UROBILINOGEN UA: 0.2 U/dL

## 2018-01-19 NOTE — Progress Notes (Signed)
ROB-pt stated that she is doing well no complaints. Other than having some carpel tunnel symptoms in her right hand.

## 2018-01-19 NOTE — Progress Notes (Signed)
ROB: Doing well.  Does note some carpal tunnel symptoms in right arm. Discussed use of brace. For second trimester screen next visit (still slightly early gestational age at today's visit). Has h/o sickle cell trait, dad is negative. RTC in 4 weeks.  Will perform anatomy scan in 6 weeks due to body habitus. 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

## 2018-02-09 NOTE — L&D Delivery Note (Signed)
Delivery Summary for Megan Sullivan  Labor Events:   Preterm labor:   Rupture date: 07/13/2018  Rupture time: 12:39 PM  Rupture type: Artificial Intact  Fluid Color:   Induction:   Augmentation:   Complications:   Cervical ripening:          Delivery:   Episiotomy:   Lacerations: Vaginal  Repair suture: 2-0 Vicryl  Repair # of packets:   Blood loss (ml): 345    Information for the patient's newborn:  Ranell, Claudio [[id number]]    Delivery 07/13/2018 10:26 PM by  Vaginal, Spontaneous Sex:  female Gestational Age: [redacted]w[redacted]d Delivery Clinician:   Living?:         APGARS  One minute Five minutes Ten minutes  Skin color:        Heart rate:        Grimace:        Muscle tone:        Breathing:        Totals: 8  9      Presentation/position:      Resuscitation:   Cord information:    Disposition of cord blood:     Blood gases sent?  Complications:   Placenta: Delivered:       appearance Newborn Measurements: Weight: 6 lb 13.4 oz (3100 g)  Height: 20.08"  Head circumference:    Chest circumference:    Other providers:    Additional  information: Forceps:   Vacuum:   Breech:   Observed anomalies        Delivery Note At 10:26 PM a viable and healthy female was delivered via Vaginal, Spontaneous (Presentation vertex: ; LOA position).  APGAR: 8, 9; weight 6 lb 13.4 oz (3100 g).   Placenta status: spontaneously removed, intact. Cord:  3-vessel with the following complications: none.  Cord pH: not obtained.  Delayed cord clamping observed.  Anesthesia:  IV Stadol, Epidural Episiotomy: None Lacerations: None Suture Repair: 2.0 vicryl Est. Blood Loss (mL): 345  Mom to postpartum.  Baby to Couplet care / Skin to Skin.  Hildred Laser, MD 07/13/2018, 11:05 PM

## 2018-02-16 ENCOUNTER — Ambulatory Visit (INDEPENDENT_AMBULATORY_CARE_PROVIDER_SITE_OTHER): Payer: BLUE CROSS/BLUE SHIELD | Admitting: Obstetrics and Gynecology

## 2018-02-16 ENCOUNTER — Encounter: Payer: Self-pay | Admitting: Obstetrics and Gynecology

## 2018-02-16 VITALS — BP 135/81 | HR 97 | Wt 253.8 lb

## 2018-02-16 DIAGNOSIS — Z3482 Encounter for supervision of other normal pregnancy, second trimester: Secondary | ICD-10-CM

## 2018-02-16 NOTE — Progress Notes (Signed)
ROB: Patient has carpal tunnel in her right wrist but is already using a nighttime wrist splint and it is helping some.  Desires quad screen today declined genetic testing.  Ultrasound already scheduled.

## 2018-02-18 LAB — AFP TETRA
DIA Mom Value: 0.64
DIA Value (EIA): 105.59 pg/mL
DSR (By Age)    1 IN: 630
DSR (Second Trimester) 1 IN: 10000
GESTATIONAL AGE AFP: 18 wk
MSAFP Mom: 1.36
MSAFP: 58.1 ng/mL
MSHCG Mom: 0.7
MSHCG: 19511 m[IU]/mL
Maternal Age At EDD: 30.8 yr
Osb Risk: 4034
T18 (By Age): 1:2456 {titer}
Test Results:: NEGATIVE
UE3 MOM: 0.97
UE3 VALUE: 1.3 ng/mL
Weight: 153 [lb_av]

## 2018-03-02 ENCOUNTER — Ambulatory Visit (INDEPENDENT_AMBULATORY_CARE_PROVIDER_SITE_OTHER): Payer: BLUE CROSS/BLUE SHIELD

## 2018-03-02 DIAGNOSIS — Z3482 Encounter for supervision of other normal pregnancy, second trimester: Secondary | ICD-10-CM

## 2018-03-16 ENCOUNTER — Ambulatory Visit (INDEPENDENT_AMBULATORY_CARE_PROVIDER_SITE_OTHER): Payer: BLUE CROSS/BLUE SHIELD | Admitting: Obstetrics and Gynecology

## 2018-03-16 VITALS — BP 128/75 | HR 93 | Wt 255.2 lb

## 2018-03-16 DIAGNOSIS — Z3A22 22 weeks gestation of pregnancy: Secondary | ICD-10-CM | POA: Diagnosis not present

## 2018-03-16 DIAGNOSIS — G5600 Carpal tunnel syndrome, unspecified upper limb: Secondary | ICD-10-CM

## 2018-03-16 DIAGNOSIS — Z3482 Encounter for supervision of other normal pregnancy, second trimester: Secondary | ICD-10-CM

## 2018-03-16 DIAGNOSIS — Z131 Encounter for screening for diabetes mellitus: Secondary | ICD-10-CM

## 2018-03-16 DIAGNOSIS — O26892 Other specified pregnancy related conditions, second trimester: Secondary | ICD-10-CM | POA: Diagnosis not present

## 2018-03-16 DIAGNOSIS — Z130 Encounter for screening for diseases of the blood and blood-forming organs and certain disorders involving the immune mechanism: Secondary | ICD-10-CM

## 2018-03-16 DIAGNOSIS — O26899 Other specified pregnancy related conditions, unspecified trimester: Secondary | ICD-10-CM

## 2018-03-16 LAB — POCT URINALYSIS DIPSTICK OB
Bilirubin, UA: NEGATIVE
Blood, UA: NEGATIVE
Glucose, UA: NEGATIVE
Ketones, UA: NEGATIVE
Leukocytes, UA: NEGATIVE
Nitrite, UA: NEGATIVE
POC,PROTEIN,UA: NEGATIVE
Spec Grav, UA: 1.025 (ref 1.010–1.025)
UROBILINOGEN UA: 0.2 U/dL
pH, UA: 6.5 (ref 5.0–8.0)

## 2018-03-16 NOTE — Progress Notes (Signed)
ROB: Doing well, still noting carpal tunnel symptoms but still using brace for right hand. RTC 4 weeks, for glucola screen at that time.

## 2018-03-16 NOTE — Progress Notes (Signed)
ROB-Pt stated that she doing well. No problems.

## 2018-04-14 ENCOUNTER — Ambulatory Visit (INDEPENDENT_AMBULATORY_CARE_PROVIDER_SITE_OTHER): Payer: 59 | Admitting: Obstetrics and Gynecology

## 2018-04-14 ENCOUNTER — Other Ambulatory Visit: Payer: 59

## 2018-04-14 ENCOUNTER — Encounter: Payer: Self-pay | Admitting: Obstetrics and Gynecology

## 2018-04-14 VITALS — BP 113/80 | HR 94 | Ht 65.0 in | Wt 259.8 lb

## 2018-04-14 DIAGNOSIS — Z23 Encounter for immunization: Secondary | ICD-10-CM | POA: Diagnosis not present

## 2018-04-14 DIAGNOSIS — Z3482 Encounter for supervision of other normal pregnancy, second trimester: Secondary | ICD-10-CM

## 2018-04-14 DIAGNOSIS — Z130 Encounter for screening for diseases of the blood and blood-forming organs and certain disorders involving the immune mechanism: Secondary | ICD-10-CM

## 2018-04-14 DIAGNOSIS — Z131 Encounter for screening for diabetes mellitus: Secondary | ICD-10-CM

## 2018-04-14 NOTE — Progress Notes (Signed)
Patient comes in today for ROB visit. No concerns today. 

## 2018-04-14 NOTE — Progress Notes (Signed)
ROB: She has no complaints.  She is doing her 1 hour GCT today.  Taking vitamins as directed.  Carpal tunnel not as bad.

## 2018-04-15 LAB — CBC
Hematocrit: 36.7 % (ref 34.0–46.6)
Hemoglobin: 12 g/dL (ref 11.1–15.9)
MCH: 27.8 pg (ref 26.6–33.0)
MCHC: 32.7 g/dL (ref 31.5–35.7)
MCV: 85 fL (ref 79–97)
PLATELETS: 181 10*3/uL (ref 150–450)
RBC: 4.32 x10E6/uL (ref 3.77–5.28)
RDW: 15.3 % (ref 11.7–15.4)
WBC: 10 10*3/uL (ref 3.4–10.8)

## 2018-04-15 LAB — GLUCOSE, 1 HOUR GESTATIONAL: Gestational Diabetes Screen: 136 mg/dL (ref 65–139)

## 2018-04-15 LAB — RPR: RPR Ser Ql: NONREACTIVE

## 2018-05-02 ENCOUNTER — Telehealth: Payer: Self-pay

## 2018-05-02 NOTE — Telephone Encounter (Signed)
Pt called no answer LM via voicemail that we were moving all of the OB pt that were scheduled in the evenings to come in the mornings. Pt was asked if she could come in on the 25th or 27th of this week. Pt was advised to call the office to schedule that appointment.

## 2018-05-05 ENCOUNTER — Encounter: Payer: 59 | Admitting: Obstetrics and Gynecology

## 2018-05-06 ENCOUNTER — Ambulatory Visit (INDEPENDENT_AMBULATORY_CARE_PROVIDER_SITE_OTHER): Payer: 59 | Admitting: Obstetrics and Gynecology

## 2018-05-06 ENCOUNTER — Encounter: Payer: Self-pay | Admitting: Obstetrics and Gynecology

## 2018-05-06 ENCOUNTER — Telehealth: Payer: Self-pay | Admitting: Obstetrics and Gynecology

## 2018-05-06 ENCOUNTER — Other Ambulatory Visit: Payer: Self-pay

## 2018-05-06 VITALS — BP 135/86 | HR 96 | Ht 65.0 in | Wt 258.5 lb

## 2018-05-06 DIAGNOSIS — Z3483 Encounter for supervision of other normal pregnancy, third trimester: Secondary | ICD-10-CM

## 2018-05-06 LAB — POCT URINALYSIS DIPSTICK OB
Bilirubin, UA: NEGATIVE
Blood, UA: NEGATIVE
Glucose, UA: NEGATIVE
Ketones, UA: NEGATIVE
Leukocytes, UA: NEGATIVE
Nitrite, UA: NEGATIVE
POC,PROTEIN,UA: NEGATIVE
Spec Grav, UA: 1.015 (ref 1.010–1.025)
Urobilinogen, UA: 0.2 E.U./dL
pH, UA: 6.5 (ref 5.0–8.0)

## 2018-05-06 NOTE — Addendum Note (Signed)
Addended by: Dorian Pod on: 05/06/2018 09:06 AM   Modules accepted: Orders

## 2018-05-06 NOTE — Progress Notes (Signed)
Patient comes for ROB visit today. No concerns today.

## 2018-05-06 NOTE — Telephone Encounter (Signed)
The patient would like to know when she will have another ultrasound. Please advise.

## 2018-05-06 NOTE — Progress Notes (Signed)
ROB: Patient without complaints.  COVID-19 discussed.

## 2018-05-06 NOTE — Patient Instructions (Signed)
Q: Why are visitor restrictions different for maternity care areas? [facility] is restricting visitors for the duration of the patient's hospitalization. The birth of a child involves the mother, considered the patient, and a birthing partner. These are unprecedented times and we are making the exception to allow a birthing partner to be a part of the patient unit. No other guests will be allowed in our [facility] at [facility] and at [facility].  Q: Are credentialed doulas allowed to support their existing patients? We acknowledge the value these doula partnerships offer our care teams and many birthing families in our communities. Each laboring mother is allowed one birthing partner of the patient's choosing for her entire hospitalization.  Q: Are visitor restrictions different for hospitalized children? Pediatric patients (infants and children under 17 years of age), such as those in the Children's Unit, Pediatric ICU and NICU, will be allowed two visitors (parents or legal guardians)  Q: Are pregnant women at an increased risk for COVID-19? The American College of Obstetricians and Gynecologists (ACOG) is monitoring closely the coronavirus pandemic. With the limited information available, data does not indicate pregnant women are at an increased risk. However, pregnant women are known to be at greater risk for respiratory infections like flu. With that in mind, expectant mothers are considered an at-risk population for COVID-19, according to ACOG.  Q: Are newborns at an increased risk for COVID-19? A limited sample of COVID-19 data with newborns indicates the virus is not transferred to the infant during pregnancy. However, postpartum separation is recommended by the Centers for Disease Control (CDC). As a result [place] recommends and strongly encourages temporary separation of moms and babies who test positive for COVID-19 or are awaiting results to rule out  COVID-19 based on CDC guidelines.  Q: If you have a suspected case of COVID-19, is the NICU couplet care room an option? No. If either patient is considered at-risk for having COVID-19, the [facility] at  Hospital will not use the NICU couplet care rooms for that family.  Q: [place] is urging that elective procedures be postponed. What is considered elective for women's and children's service line? NOT ELECTIVE: Obstetric procedures, even those with an element of choice on timing, are not considered elective. Circumcisions are considered elective procedures, however, these do not deplete blood products and other resources, which is the spirit in which the COVID-19 postponement of elective procedures was intended. Therefore, circumcisions will be allowed.  ELECTIVE: Postpartum tubal ligations are considered elective and should be postponed.  [facility] supports as much as possible the medical care team working with the patient's individual needs to address timing during these unprecedented times. We seek the support of our medical care team in preserving needed resources throughout our crisis response to COVID-19.  Q: How does COVID-19 impact breastfeeding? Breastmilk is safe for your baby - even if the mother has tested positive for COVID-19. We suggest the mother pump her milk and have a healthy family member feed the baby to protect the baby from getting the virus.  If a COVID-19+ mother decides to breastfeed after discharge, we suggest proper protective equipment be worn and hand hygiene be performed before and after feeding the infant.  Q: Should we urge patients to avoid baby showers and large gatherings? Yes. As has been recommended for all citizens in our communities, gatherings of 10 or more should be avoided - pregnant or not. Seek creative options   for "hosting" baby showers through electronic means that honor the request for social distancing during this  time of heightened awareness.  Q: Should patients miss their prenatal appointments? No. Prenatal visits are NOT elective. While we want to limit contact and exposure, prenatal care is vital right now. Contact your physician's office if you have concerns about your visits. We are limiting outpatient office visits to the patient and one guest in order to reduce the potential for exposure.  Q: What if a pregnant woman feels sick? Should she miss her prenatal visit then? A pregnant woman experiencing coronavirus-like symptoms (i.e., cough, fever, difficulty breathing, shortness of breath, gastrointestinal issues) should contact her pregnancy care provider by phone. Her medical professional can best determine whether she should use a video visit or possibly go to a collection site to be tested for COVID-19. Contacting her primary care provider or her pregnancy care provider is her first step.  Q: What can I do about childbirth education? All the classes are cancelled. The [facility] will offer online learning to support mothers on their journey.  We currently offer Understanding Childbirth, Understanding Breastfeeding and Understanding Newborn Care as an online class.  Please visit our website, [email or web address], to register for an online class.  Q: How can I keep from getting COVID-19? Together, we can reduce the risk of exposure to the virus and help you and your family remain healthy and safe. One of the best ways to protect yourself is to wash your hands frequently using soap and water. Also, you should avoid touching your eyes, nose and mouth with unwashed hands, avoid physical contact with others and practice social distancing.   

## 2018-05-06 NOTE — Telephone Encounter (Signed)
Spoke with patient and let her know that at this point there is no plan to have another one per Dr. Logan Bores. Patient verbalized understanding.

## 2018-06-02 ENCOUNTER — Other Ambulatory Visit: Payer: Self-pay

## 2018-06-02 ENCOUNTER — Encounter: Payer: Self-pay | Admitting: Obstetrics and Gynecology

## 2018-06-02 ENCOUNTER — Ambulatory Visit (INDEPENDENT_AMBULATORY_CARE_PROVIDER_SITE_OTHER): Payer: 59 | Admitting: Obstetrics and Gynecology

## 2018-06-02 VITALS — BP 124/74 | HR 109 | Wt 261.9 lb

## 2018-06-02 DIAGNOSIS — Z3483 Encounter for supervision of other normal pregnancy, third trimester: Secondary | ICD-10-CM

## 2018-06-02 LAB — POCT URINALYSIS DIPSTICK OB
Bilirubin, UA: NEGATIVE
Blood, UA: NEGATIVE
Glucose, UA: NEGATIVE
Ketones, UA: NEGATIVE
Leukocytes, UA: NEGATIVE
Nitrite, UA: NEGATIVE
POC,PROTEIN,UA: NEGATIVE
Spec Grav, UA: 1.015 (ref 1.010–1.025)
Urobilinogen, UA: 0.2 E.U./dL
pH, UA: 6.5 (ref 5.0–8.0)

## 2018-06-02 NOTE — Progress Notes (Signed)
ROB: Doing well, no major complaints. Is noting pelvic pressure. Discussed alleviating measures. Discussed COVID-19 questions.  Also discussed general questions regarding labor and delivery. RTC in 3 weeks. For 36 week cultures at that time.

## 2018-06-02 NOTE — Progress Notes (Signed)
ROB-Pt is present today for prenatal care. Pt stated that she is having pressure and pain in the bladder area. Pt stated that she think that she is having braxton hicks contractions.

## 2018-06-15 DIAGNOSIS — Z0289 Encounter for other administrative examinations: Secondary | ICD-10-CM

## 2018-06-20 ENCOUNTER — Telehealth: Payer: Self-pay

## 2018-06-20 NOTE — Telephone Encounter (Signed)
Pt called no answer LMTRC to office for prescreening.

## 2018-06-21 ENCOUNTER — Ambulatory Visit (INDEPENDENT_AMBULATORY_CARE_PROVIDER_SITE_OTHER): Payer: 59 | Admitting: Obstetrics and Gynecology

## 2018-06-21 ENCOUNTER — Other Ambulatory Visit: Payer: Self-pay

## 2018-06-21 VITALS — BP 112/84 | HR 105 | Ht 65.0 in | Wt 265.1 lb

## 2018-06-21 DIAGNOSIS — Z3483 Encounter for supervision of other normal pregnancy, third trimester: Secondary | ICD-10-CM

## 2018-06-21 DIAGNOSIS — Z6839 Body mass index (BMI) 39.0-39.9, adult: Secondary | ICD-10-CM

## 2018-06-21 DIAGNOSIS — E6609 Other obesity due to excess calories: Secondary | ICD-10-CM

## 2018-06-21 NOTE — Progress Notes (Signed)
Patient comes in today for ROB visit. She has no concerns today.  

## 2018-06-21 NOTE — Progress Notes (Signed)
ROB:  GBS GC/Ct done today.  Anes consult for BMI.  Begin weekly NSTs.

## 2018-06-23 LAB — STREP GP B NAA: Strep Gp B NAA: POSITIVE — AB

## 2018-06-24 ENCOUNTER — Telehealth: Payer: Self-pay | Admitting: Obstetrics and Gynecology

## 2018-06-24 NOTE — Telephone Encounter (Signed)
The patient called and stated that she would like to speak with a nurse in regards to her not hearing any thing from a anesthesiologist. The patient is requesting a call back. Please advise.

## 2018-06-24 NOTE — Telephone Encounter (Signed)
Pt called no answer LM that DJE and Asher Muir would be given a message to check on her referral. Pt was informed via LM that someone would call her on Monday with more information.

## 2018-06-25 LAB — GC/CHLAMYDIA PROBE AMP
Chlamydia trachomatis, NAA: NEGATIVE
Neisseria Gonorrhoeae by PCR: NEGATIVE

## 2018-06-27 NOTE — Telephone Encounter (Signed)
Spoke with Morrie Sheldon and she has sent Dr. Priscella Mann about the patient. She is waiting to hear if the patient needs a consult. She stated that the patient was so close to the BMI cut she may not need. Morrie Sheldon will call the patient if she needs an appointment. I have let the patient know.

## 2018-06-28 ENCOUNTER — Ambulatory Visit (INDEPENDENT_AMBULATORY_CARE_PROVIDER_SITE_OTHER): Payer: 59 | Admitting: Obstetrics and Gynecology

## 2018-06-28 ENCOUNTER — Encounter: Payer: Self-pay | Admitting: Obstetrics and Gynecology

## 2018-06-28 ENCOUNTER — Other Ambulatory Visit: Payer: Self-pay

## 2018-06-28 ENCOUNTER — Other Ambulatory Visit: Payer: 59

## 2018-06-28 VITALS — BP 133/72 | HR 103 | Wt 267.4 lb

## 2018-06-28 DIAGNOSIS — O99210 Obesity complicating pregnancy, unspecified trimester: Secondary | ICD-10-CM

## 2018-06-28 DIAGNOSIS — O99820 Streptococcus B carrier state complicating pregnancy: Secondary | ICD-10-CM

## 2018-06-28 DIAGNOSIS — O99213 Obesity complicating pregnancy, third trimester: Secondary | ICD-10-CM

## 2018-06-28 DIAGNOSIS — Z3483 Encounter for supervision of other normal pregnancy, third trimester: Secondary | ICD-10-CM

## 2018-06-28 LAB — POCT URINALYSIS DIPSTICK OB
Bilirubin, UA: NEGATIVE
Blood, UA: NEGATIVE
Glucose, UA: NEGATIVE
Ketones, UA: NEGATIVE
Nitrite, UA: NEGATIVE
POC,PROTEIN,UA: NEGATIVE
Spec Grav, UA: 1.025 (ref 1.010–1.025)
Urobilinogen, UA: 0.2 E.U./dL
pH, UA: 6 (ref 5.0–8.0)

## 2018-06-28 NOTE — Progress Notes (Signed)
ROB: Patient denies major complaints.  Doing well. Further discussed need for IOL by 40 weeks due to morbid obesity in pregnancy (current BMI 44). Answered all questions regarding induction. To be scheduled at next visit. Discussed pain management in labor. NST performed today was reviewed and was found to be reactive.  Continue recommended antenatal testing and prenatal care.  GBS positive, discussed need for antibiotics in labor. RTC in 1 week, for growth scan and BPP (in lieu of NST that visit).    NONSTRESS TEST INTERPRETATION  INDICATIONS: Obesity  FHR baseline: 145 bpm RESULTS:Reactive COMMENTS: None   PLAN: 1. Continue fetal kick counts twice a day. 2. Continue antepartum testing as scheduled-weekly

## 2018-06-28 NOTE — Patient Instructions (Signed)
Group B Streptococcus Infection During Pregnancy  Group B Streptococcus (GBS) is a type of bacteria (Streptococcus agalactiae) that is often found in healthy people, commonly in the rectum, vagina, and intestines. In people who are healthy and not pregnant, the bacteria rarely cause serious illness or complications. However, women who test positive for GBS during pregnancy can pass the bacteria to their baby during childbirth, which can cause serious infection in the baby after birth. Women with GBS may also have infections during their pregnancy or immediately after childbirth, such as such as urinary tract infections (UTIs) or infections of the uterus (uterine infections). Having GBS also increases a woman's risk of complications during pregnancy, such as early (preterm) labor or delivery, miscarriage, or stillbirth. Routine testing (screening) for GBS is recommended for all pregnant women. What increases the risk? You may have a higher risk for GBS infection during pregnancy if you had one during a past pregnancy. What are the signs or symptoms? In most cases, GBS infection does not cause symptoms in pregnant women. Signs and symptoms of a possible GBS-related infection may include:  Labor starting before the 37th week of pregnancy.  A UTI or bladder infection, which may cause: ? Fever. ? Pain or burning during urination. ? Frequent urination.  Fever during labor, along with: ? Bad-smelling discharge. ? Uterine tenderness. ? Rapid heartbeat in the mother, baby, or both. Rare but serious symptoms of a possible GBS-related infection in women include:  Blood infection (septicemia). This may cause fever, chills, or confusion.  Lung infection (pneumonia). This may cause fever, chills, cough, rapid breathing, difficulty breathing, or chest pain.  Bone, joint, skin, or soft tissue infection. How is this diagnosed? You may be screened for GBS between week 35 and week 37 of your pregnancy. If  you have symptoms of preterm labor, you may be screened earlier. This condition is diagnosed based on lab test results from:  A swab of fluid from the vagina and rectum.  A urine sample. How is this treated? This condition is treated with antibiotic medicine. When you go into labor, or as soon as your water breaks (your membranes rupture), you will be given antibiotics through an IV tube. Antibiotics will continue until after you give birth. If you are having a cesarean delivery, you do not need antibiotics unless your membranes have already ruptured. Follow these instructions at home:  Take over-the-counter and prescription medicines only as told by your health care provider.  Take your antibiotic medicine as told by your health care provider. Do not stop taking the antibiotic even if you start to feel better.  Keep all pre-birth (prenatal) visits and follow-up visits as told by your health care provider. This is important. Contact a health care provider if:  You have pain or burning when you urinate.  You have to urinate frequently.  You have a fever or chills.  You develop a bad-smelling vaginal discharge. Get help right away if:  Your membranes rupture.  You go into labor.  You have severe pain in your abdomen.  You have difficulty breathing.  You have chest pain. This information is not intended to replace advice given to you by your health care provider. Make sure you discuss any questions you have with your health care provider. Document Released: 05/05/2007 Document Revised: 08/23/2015 Document Reviewed: 08/22/2015 Elsevier Interactive Patient Education  2019 [employer]    Labor Induction  Labor induction is when steps are taken to cause a pregnant woman to begin the  labor process. Most women go into labor on their own between 37 weeks and 42 weeks of pregnancy. When this does not happen or when there is a medical need for labor to begin, steps may be taken to  induce labor. Labor induction causes a pregnant woman's uterus to contract. It also causes the cervix to soften (ripen), open (dilate), and thin out (efface). Usually, labor is not induced before 39 weeks of pregnancy unless there is a medical reason to do so. Your health care provider will determine if labor induction is needed. Before inducing labor, your health care provider will consider a number of factors, including:  Your medical condition and your baby's.  How many weeks along you are in your pregnancy.  How mature your baby's lungs are.  The condition of your cervix.  The position of your baby.  The size of your birth canal. What are some reasons for labor induction? Labor may be induced if:  Your health or your baby's health is at risk.  Your pregnancy is overdue by 1 week or more.  Your water breaks but labor does not start on its own.  There is a low amount of amniotic fluid around your baby. You may also choose (elect) to have labor induced at a certain time. Generally, elective labor induction is done no earlier than 39 weeks of pregnancy. What methods are used for labor induction? Methods used for labor induction include:  Prostaglandin medicine. This medicine starts contractions and causes the cervix to dilate and ripen. It can be taken by mouth (orally) or by being inserted into the vagina (suppository).  Inserting a small, thin tube (catheter) with a balloon into the vagina and then expanding the balloon with water to dilate the cervix.  Stripping the membranes. In this method, your health care provider gently separates amniotic sac tissue from the cervix. This causes the cervix to stretch, which in turn causes the release of a hormone called progesterone. The hormone causes the uterus to contract. This procedure is often done during an office visit, after which you will be sent home to wait for contractions to begin.  Breaking the water. In this method, your  health care provider uses a small instrument to make a small hole in the amniotic sac. This eventually causes the amniotic sac to break. Contractions should begin after a few hours.  Medicine to trigger or strengthen contractions. This medicine is given through an IV that is inserted into a vein in your arm. Except for membrane stripping, which can be done in a clinic, labor induction is done in the hospital so that you and your baby can be carefully monitored. How long does it take for labor to be induced? The length of time it takes to induce labor depends on how ready your body is for labor. Some inductions can take up to 2-3 days, while others may take less than a day. Induction may take longer if:  You are induced early in your pregnancy.  It is your first pregnancy.  Your cervix is not ready. What are some risks associated with labor induction? Some risks associated with labor induction include:  Changes in fetal heart rate, such as being too high, too low, or irregular (erratic).  Failed induction.  Infection in the mother or the baby.  Increased risk of having a cesarean delivery.  Fetal death.  Breaking off (abruption) of the placenta from the uterus (rare).  Rupture of the uterus (very rare). When induction is  needed for medical reasons, the benefits of induction generally outweigh the risks. What are some reasons for not inducing labor? Labor induction should not be done if:  Your baby does not tolerate contractions.  You have had previous surgeries on your uterus, such as a myomectomy, removal of fibroids, or a vertical scar from a previous cesarean delivery.  Your placenta lies very low in your uterus and blocks the opening of the cervix (placenta previa).  Your baby is not in a head-down position.  The umbilical cord drops down into the birth canal in front of the baby.  There are unusual circumstances, such as the baby being very early (premature).  You have  had more than 2 previous cesarean deliveries. Summary  Labor induction is when steps are taken to cause a pregnant woman to begin the labor process.  Labor induction causes a pregnant woman's uterus to contract. It also causes the cervix to ripen, dilate, and efface.  Labor is not induced before 39 weeks of pregnancy unless there is a medical reason to do so.  When induction is needed for medical reasons, the benefits of induction generally outweigh the risks. This information is not intended to replace advice given to you by your health care provider. Make sure you discuss any questions you have with your health care provider. Document Released: 06/17/2006 Document Revised: 03/11/2016 Document Reviewed: 03/11/2016 Elsevier Interactive Patient Education  2019 [employer].

## 2018-06-28 NOTE — Progress Notes (Signed)
Pt is present today for prenatal care and NST. Pt stated that she is doing well other than having some lower abd pain and braxton hicks contractions.

## 2018-07-01 ENCOUNTER — Telehealth: Payer: Self-pay

## 2018-07-01 NOTE — Telephone Encounter (Signed)
Coronavirus (COVID-19) Are you at risk?  Are you at risk for the Coronavirus (COVID-19)?  To be considered HIGH RISK for Coronavirus (COVID-19), you have to meet the following criteria:  . Traveled to [place], [place], [place], [place] or [place]; or in the [place] to [place], [place], [place], or [place]; and have fever, cough, and shortness of breath within the last 2 weeks of travel OR . Been in close contact with a person diagnosed with COVID-19 within the last 2 weeks and have fever, cough, and shortness of breath . IF YOU DO NOT MEET THESE CRITERIA, YOU ARE CONSIDERED LOW RISK FOR COVID-19.  What to do if you are HIGH RISK for COVID-19?  . If you are having a medical emergency, call 911. . Seek medical care right away. Before you go to a doctor's office, urgent care or emergency department, call ahead and tell them about your recent travel, contact with someone diagnosed with COVID-19, and your symptoms. You should receive instructions from your physician's office regarding next steps of care.  . When you arrive at healthcare provider, tell the healthcare staff immediately you have returned from visiting [place], [place], [place], [place] or [place]; or traveled in the [place] to [place], [place], [place], or [place]; in the last two weeks or you have been in close contact with a person diagnosed with COVID-19 in the last 2 weeks.   . Tell the health care staff about your symptoms: fever, cough and shortness of breath. . After you have been seen by a medical provider, you will be either: o Tested for (COVID-19) and discharged home on quarantine except to seek medical care if symptoms worsen, and asked to  - Stay home and avoid contact with others until you get your results (4-5 days)  - Avoid travel on public transportation if possible (such as bus, train, or airplane) or o Sent to the Emergency Department by EMS for evaluation, COVID-19 testing, and possible  admission depending on your condition and test results.  What to do if you are LOW RISK for COVID-19?  Reduce your risk of any infection by using the same precautions used for avoiding the common cold or flu:  . Wash your hands often with soap and warm water for at least 20 seconds.  If soap and water are not readily available, use an alcohol-based hand sanitizer with at least 60% alcohol.  . If coughing or sneezing, cover your mouth and nose by coughing or sneezing into the elbow areas of your shirt or coat, into a tissue or into your sleeve (not your hands). . Avoid shaking hands with others and consider head nods or verbal greetings only. . Avoid touching your eyes, nose, or mouth with unwashed hands.  . Avoid close contact with people who are sick. . Avoid places or events with large numbers of people in one location, like concerts or sporting events. . Carefully consider travel plans you have or are making. . If you are planning any travel outside or inside the [place], visit the CDC's Travelers' Health webpage for the latest health notices. . If you have some symptoms but not all symptoms, continue to monitor at home and seek medical attention if your symptoms worsen. . If you are having a medical emergency, call 911.   ADDITIONAL HEALTHCARE OPTIONS FOR PATIENTS  [facility] / e-Visit: [email or web address]         [facility]: [phone]  [facility]: [phone]                   [facility]: [phone]   Prescreened. Neg .cm 

## 2018-07-05 ENCOUNTER — Ambulatory Visit (INDEPENDENT_AMBULATORY_CARE_PROVIDER_SITE_OTHER): Payer: 59 | Admitting: Obstetrics and Gynecology

## 2018-07-05 ENCOUNTER — Ambulatory Visit (INDEPENDENT_AMBULATORY_CARE_PROVIDER_SITE_OTHER): Payer: 59

## 2018-07-05 ENCOUNTER — Other Ambulatory Visit: Payer: Self-pay

## 2018-07-05 VITALS — BP 118/72 | HR 102 | Ht 65.0 in | Wt 268.1 lb

## 2018-07-05 DIAGNOSIS — Z3483 Encounter for supervision of other normal pregnancy, third trimester: Secondary | ICD-10-CM

## 2018-07-05 DIAGNOSIS — O99210 Obesity complicating pregnancy, unspecified trimester: Secondary | ICD-10-CM | POA: Diagnosis not present

## 2018-07-05 DIAGNOSIS — O99213 Obesity complicating pregnancy, third trimester: Secondary | ICD-10-CM

## 2018-07-05 DIAGNOSIS — O99820 Streptococcus B carrier state complicating pregnancy: Secondary | ICD-10-CM

## 2018-07-05 NOTE — Progress Notes (Signed)
Patient comes in today for ROB. No concerns today.  

## 2018-07-05 NOTE — Progress Notes (Signed)
ROB: Patient says she is not having any contractions.  Rationale for induction of labor and obesity discussed in detail.  Method of induction discussed in detail.   All questions answered.  Scheduled for 6/2 midnight +1.  COVID testing discussed.  BPP scheduled for today with growth ultrasound.

## 2018-07-08 ENCOUNTER — Other Ambulatory Visit: Payer: Self-pay

## 2018-07-08 ENCOUNTER — Telehealth: Payer: Self-pay | Admitting: Obstetrics and Gynecology

## 2018-07-08 ENCOUNTER — Ambulatory Visit
Admission: RE | Admit: 2018-07-08 | Discharge: 2018-07-08 | Disposition: A | Discharge status: Home / Self Care | Payer: 59 | Source: Ambulatory Visit | Attending: Obstetrics and Gynecology | Admitting: Obstetrics and Gynecology

## 2018-07-08 DIAGNOSIS — Z1159 Encounter for screening for other viral diseases: Secondary | ICD-10-CM | POA: Insufficient documentation

## 2018-07-08 NOTE — Telephone Encounter (Signed)
Pt called no answer LM to call the office to speak more about her concerns for calling this morning. Pt was informed that I was going to lunch and would return after 1:30pm.

## 2018-07-08 NOTE — Telephone Encounter (Signed)
The patient called and stated that she has some questions in regards to having some covid-19 lab testing done/ Questions and concerns in regards to a MyChart message. Please advise.

## 2018-07-09 LAB — NOVEL CORONAVIRUS, NAA (HOSP ORDER, SEND-OUT TO REF LAB; TAT 18-24 HRS): SARS-CoV-2, NAA: NOT DETECTED

## 2018-07-11 ENCOUNTER — Telehealth: Payer: Self-pay | Admitting: Obstetrics and Gynecology

## 2018-07-11 NOTE — Telephone Encounter (Signed)
The patient is requesting a call back in regards to her having questions about her induction. Please advise.

## 2018-07-11 NOTE — Telephone Encounter (Signed)
Pt was advise to call L&D to see if she needed to do anything else before her induction.

## 2018-07-12 ENCOUNTER — Other Ambulatory Visit: Payer: Self-pay

## 2018-07-12 ENCOUNTER — Inpatient Hospital Stay
Admission: RE | Admit: 2018-07-12 | Discharge: 2018-07-15 | DRG: 807 | Disposition: A | Discharge status: Home / Self Care | Payer: 59 | Attending: Obstetrics and Gynecology | Admitting: Obstetrics and Gynecology

## 2018-07-12 DIAGNOSIS — O99824 Streptococcus B carrier state complicating childbirth: Secondary | ICD-10-CM | POA: Diagnosis not present

## 2018-07-12 DIAGNOSIS — D573 Sickle-cell trait: Secondary | ICD-10-CM | POA: Diagnosis present

## 2018-07-12 DIAGNOSIS — Z3A39 39 weeks gestation of pregnancy: Secondary | ICD-10-CM

## 2018-07-12 DIAGNOSIS — O9902 Anemia complicating childbirth: Secondary | ICD-10-CM | POA: Diagnosis present

## 2018-07-12 DIAGNOSIS — O99214 Obesity complicating childbirth: Principal | ICD-10-CM | POA: Diagnosis present

## 2018-07-12 DIAGNOSIS — O99210 Obesity complicating pregnancy, unspecified trimester: Secondary | ICD-10-CM | POA: Diagnosis present

## 2018-07-12 LAB — CBC
HCT: 37.4 % (ref 36.0–46.0)
Hemoglobin: 12.6 g/dL (ref 12.0–15.0)
MCH: 26.8 pg (ref 26.0–34.0)
MCHC: 33.7 g/dL (ref 30.0–36.0)
MCV: 79.4 fL — ABNORMAL LOW (ref 80.0–100.0)
Platelets: 185 10*3/uL (ref 150–400)
RBC: 4.71 MIL/uL (ref 3.87–5.11)
RDW: 16.8 % — ABNORMAL HIGH (ref 11.5–15.5)
WBC: 14.1 10*3/uL — ABNORMAL HIGH (ref 4.0–10.5)
nRBC: 0 % (ref 0.0–0.2)

## 2018-07-12 LAB — TYPE AND SCREEN
ABO/RH(D): O POS
Antibody Screen: NEGATIVE

## 2018-07-12 MED ORDER — SOD CITRATE-CITRIC ACID 500-334 MG/5ML PO SOLN: 30.0000 mL | ORAL | Status: DC | PRN

## 2018-07-12 MED ORDER — LACTATED RINGERS IV SOLN
500.0000 mL | INTRAVENOUS | Status: DC | PRN
  Administered 2018-07-13: 500 mL via INTRAVENOUS

## 2018-07-12 MED ORDER — ACETAMINOPHEN 325 MG PO TABS
650.0000 mg | ORAL_TABLET | ORAL | Status: DC | PRN
  Administered 2018-07-12: 22:00:00 650 mg via ORAL
  Filled 2018-07-12: qty 2

## 2018-07-12 MED ORDER — LACTATED RINGERS IV BOLUS
1000.0000 mL | Freq: Once | INTRAVENOUS | Status: AC
  Administered 2018-07-12: 17:00:00 1000 mL via INTRAVENOUS

## 2018-07-12 MED ORDER — SODIUM CHLORIDE 0.9 % IV SOLN
2.0000 g | INTRAVENOUS | Status: DC
  Administered 2018-07-13: 09:00:00 2 g via INTRAVENOUS
  Filled 2018-07-12: qty 2000

## 2018-07-12 MED ORDER — LIDOCAINE HCL (PF) 1 % IJ SOLN: 30.0000 mL | INTRAMUSCULAR | Status: DC | PRN

## 2018-07-12 MED ORDER — MISOPROSTOL 50MCG HALF TABLET
50.0000 ug | ORAL_TABLET | ORAL | Status: DC | PRN
  Administered 2018-07-12 – 2018-07-13 (×3): 50 ug via VAGINAL
  Filled 2018-07-12 (×3): qty 1

## 2018-07-12 MED ORDER — OXYTOCIN 40 UNITS IN NORMAL SALINE INFUSION - SIMPLE MED
2.5000 [IU]/h | INTRAVENOUS | Status: DC
  Administered 2018-07-13: 2.5 [IU]/h via INTRAVENOUS
  Filled 2018-07-12: qty 1000

## 2018-07-12 MED ORDER — BUTORPHANOL TARTRATE 2 MG/ML IJ SOLN
1.0000 mg | INTRAMUSCULAR | Status: DC | PRN
  Administered 2018-07-13 (×2): 1 mg via INTRAVENOUS
  Filled 2018-07-12 (×2): qty 1

## 2018-07-12 MED ORDER — LACTATED RINGERS IV SOLN
INTRAVENOUS | Status: DC
  Administered 2018-07-12 – 2018-07-13 (×4): via INTRAVENOUS

## 2018-07-12 MED ORDER — OXYTOCIN BOLUS FROM INFUSION
500.0000 mL | Freq: Once | INTRAVENOUS | Status: AC
  Administered 2018-07-13: 23:00:00 500 mL via INTRAVENOUS

## 2018-07-12 MED ORDER — ONDANSETRON HCL 4 MG/2ML IJ SOLN: 4.0000 mg | Freq: Four times a day (QID) | INTRAMUSCULAR | Status: DC | PRN

## 2018-07-12 NOTE — H&P (Signed)
History and Physical   HPI  Megan Sullivan is a 33 y.o. G2P0010 at [redacted]w[redacted]d Estimated Date of Delivery: 07/14/18 who is being admitted for induction of labor for obesity.   OB History  OB History  Gravida Para Term Preterm AB Living  2 0 0 0 1 0  SAB TAB Ectopic Multiple Live Births  0 0 0 0 0    # Outcome Date GA Lbr Len/2nd Weight Sex Delivery Anes PTL Lv  2 Current           1 AB             PROBLEM LIST  Pregnancy complications or risks: Patient Active Problem List   Diagnosis Date Noted  . Pregnancy complicated by obesity 07/12/2018  . Group B streptococcal carriage complicating pregnancy 06/28/2018  . Obesity in pregnancy 06/28/2018  . Carpal tunnel syndrome during pregnancy 03/16/2018  . Supervision of normal pregnancy 01/19/2018  . History of miscarriage, currently pregnant, second trimester 01/19/2018  . Sickle cell trait (HCC) 01/19/2018    Prenatal labs and studies: ABO, Rh: --/--/O POS (06/02 1617) Antibody: NEG (06/02 1617) Rubella: 3.52 (10/25 1520) RPR: Non Reactive (03/05 1114)  HBsAg: Negative (10/25 1520)  HIV: Non Reactive (10/25 1520)  GMW:NUUVOZDG (05/12 1227)   Past Medical History:  Diagnosis Date  . Asthma   . Migraines      Past Surgical History:  Procedure Laterality Date  . DILATION AND CURETTAGE OF UTERUS  2018  . OVARIAN CYST REMOVAL  2017     Medications    Current Discharge Medication List    CONTINUE these medications which have NOT CHANGED   Details  Prenatal Vit-Fe Fumarate-FA (PRENATAL MULTIVITAMIN) TABS tablet Take 1 tablet by mouth daily at 12 noon.    acetaminophen (TYLENOL) 500 MG tablet Take 500 mg by mouth every 6 (six) hours as needed (1-2 tablets once daily PRN).   Associated Diagnoses: Encounter for supervision of normal first pregnancy in first trimester         Allergies  Patient has no known allergies.  Review of Systems  Pertinent items are noted in HPI.  Physical Exam  BP 133/82 (BP  Location: Left Arm)   Pulse (!) 107   Temp 98.6 F (37 C) (Oral)   Resp 18   Ht 5\' 5"  (1.651 m)   Wt 121.6 kg   LMP 10/05/2017 (Exact Date)   SpO2 100%   BMI 44.60 kg/m   Lungs:  CTA B Cardio: RRR without M/R/G Abd: Soft, gravid, NT Presentation: cephalic EXT: No C/C/ 1+ Edema DTRs: 2+ B CERVIX: Dilation: Closed Effacement (%): Thick Cervical Position: Middle Station: -3 Presentation: Vertex Exam by:: Dr. Logan Bores   Misoprotsol placed intravaginally.     FHR:  Variability: Good {> 6 bpm)  Toco: Uterine Contractions: Rare - not painful   Test Results  Results for orders placed or performed during the hospital encounter of 07/12/18 (from the past 24 hour(s))  CBC     Status: Abnormal   Collection Time: 07/12/18  4:17 PM  Result Value Ref Range   WBC 14.1 (H) 4.0 - 10.5 K/uL   RBC 4.71 3.87 - 5.11 MIL/uL   Hemoglobin 12.6 12.0 - 15.0 g/dL   HCT 64.4 03.4 - 74.2 %   MCV 79.4 (L) 80.0 - 100.0 fL   MCH 26.8 26.0 - 34.0 pg   MCHC 33.7 30.0 - 36.0 g/dL   RDW 59.5 (H) 63.8 - 75.6 %  Platelets 185 150 - 400 K/uL   nRBC 0.0 0.0 - 0.2 %  Type and screen     Status: None   Collection Time: 07/12/18  4:17 PM  Result Value Ref Range   ABO/RH(D) O POS    Antibody Screen NEG    Sample Expiration      07/15/2018,2359 Performed at [facility], [address] [place], [place] [address]    Group B Strep positive  Assessment  G2P0010 at 9593w5d Estimated Date of Delivery: 07/14/18  The fetus is reassuring.   Patient Active Problem List   Diagnosis Date Noted  . Pregnancy complicated by obesity 07/12/2018  . Group B streptococcal carriage complicating pregnancy 06/28/2018  . Obesity in pregnancy 06/28/2018  . Carpal tunnel syndrome during pregnancy 03/16/2018  . Supervision of normal pregnancy 01/19/2018  . History of miscarriage, currently pregnant, second trimester 01/19/2018  . Sickle cell trait (HCC) 01/19/2018    Plan  1. Admit to L&D :     2. EFM: -- Category 1 3. Epidural if desired.  Stadol for IV pain until epidural requested. 4. Admission labs   Elonda Huskyavid J. Ronasia Isola, M.D. 07/12/2018 5:16 PM

## 2018-07-13 ENCOUNTER — Inpatient Hospital Stay: Payer: 59 | Admitting: Anesthesiology

## 2018-07-13 ENCOUNTER — Encounter: Payer: Self-pay | Admitting: Anesthesiology

## 2018-07-13 DIAGNOSIS — Z3A39 39 weeks gestation of pregnancy: Secondary | ICD-10-CM

## 2018-07-13 DIAGNOSIS — O99824 Streptococcus B carrier state complicating childbirth: Secondary | ICD-10-CM

## 2018-07-13 DIAGNOSIS — O99214 Obesity complicating childbirth: Principal | ICD-10-CM

## 2018-07-13 LAB — RPR: RPR Ser Ql: NONREACTIVE

## 2018-07-13 MED ORDER — PHENYLEPHRINE 40 MCG/ML (10ML) SYRINGE FOR IV PUSH (FOR BLOOD PRESSURE SUPPORT): 80.0000 ug | PREFILLED_SYRINGE | INTRAVENOUS | Status: DC | PRN

## 2018-07-13 MED ORDER — SODIUM CHLORIDE 0.9 % IV SOLN
INTRAVENOUS | Status: DC | PRN
  Administered 2018-07-13 (×2): 5 mL via EPIDURAL

## 2018-07-13 MED ORDER — ONDANSETRON HCL 4 MG/2ML IJ SOLN: 4.0000 mg | INTRAMUSCULAR | Status: DC | PRN

## 2018-07-13 MED ORDER — LACTATED RINGERS AMNIOINFUSION
INTRAVENOUS | Status: DC
  Administered 2018-07-13: 20:00:00 via INTRAUTERINE

## 2018-07-13 MED ORDER — DIPHENHYDRAMINE HCL 50 MG/ML IJ SOLN: 12.5000 mg | INTRAMUSCULAR | Status: DC | PRN

## 2018-07-13 MED ORDER — ONDANSETRON HCL 4 MG PO TABS: 4.0000 mg | ORAL_TABLET | ORAL | Status: DC | PRN

## 2018-07-13 MED ORDER — SODIUM CHLORIDE 0.9 % IV SOLN
1.0000 g | INTRAVENOUS | Status: DC
  Administered 2018-07-13 (×3): 1 g via INTRAVENOUS
  Filled 2018-07-13 (×3): qty 1000

## 2018-07-13 MED ORDER — LACTATED RINGERS AMNIOINFUSION: INTRAVENOUS | Status: DC

## 2018-07-13 MED ORDER — LIDOCAINE-EPINEPHRINE (PF) 1.5 %-1:200000 IJ SOLN
INTRAMUSCULAR | Status: DC | PRN
  Administered 2018-07-13: 3 mL via EPIDURAL

## 2018-07-13 MED ORDER — FERROUS SULFATE 325 (65 FE) MG PO TABS
325.0000 mg | ORAL_TABLET | Freq: Two times a day (BID) | ORAL | Status: DC
  Administered 2018-07-14 – 2018-07-15 (×3): 325 mg via ORAL
  Filled 2018-07-13 (×4): qty 1

## 2018-07-13 MED ORDER — LACTATED RINGERS IV SOLN
500.0000 mL | Freq: Once | INTRAVENOUS | Status: AC
  Administered 2018-07-13: 500 mL via INTRAVENOUS

## 2018-07-13 MED ORDER — SODIUM CHLORIDE FLUSH 0.9 % IV SOLN
INTRAVENOUS | Status: AC
  Filled 2018-07-13: qty 40

## 2018-07-13 MED ORDER — IBUPROFEN 800 MG PO TABS
800.0000 mg | ORAL_TABLET | Freq: Four times a day (QID) | ORAL | Status: DC
  Administered 2018-07-14 – 2018-07-15 (×5): 800 mg via ORAL
  Filled 2018-07-13 (×7): qty 1

## 2018-07-13 MED ORDER — EPHEDRINE 5 MG/ML INJ: 10.0000 mg | INTRAVENOUS | Status: DC | PRN

## 2018-07-13 MED ORDER — FENTANYL 2.5 MCG/ML W/ROPIVACAINE 0.15% IN NS 100 ML EPIDURAL (ARMC)
12.0000 mL/h | EPIDURAL | Status: DC
  Administered 2018-07-13: 12 mL/h via EPIDURAL

## 2018-07-13 MED ORDER — COCONUT OIL OIL
1.0000 "application " | TOPICAL_OIL | Status: DC | PRN
  Administered 2018-07-14: 1 via TOPICAL
  Filled 2018-07-13 (×3): qty 120

## 2018-07-13 MED ORDER — ACETAMINOPHEN 325 MG PO TABS
650.0000 mg | ORAL_TABLET | ORAL | Status: DC | PRN
  Administered 2018-07-14 (×2): 650 mg via ORAL
  Filled 2018-07-13 (×5): qty 2

## 2018-07-13 MED ORDER — LIDOCAINE HCL (PF) 1 % IJ SOLN
INTRAMUSCULAR | Status: DC | PRN
  Administered 2018-07-13: 3 mL

## 2018-07-13 MED ORDER — FENTANYL 2.5 MCG/ML W/ROPIVACAINE 0.15% IN NS 100 ML EPIDURAL (ARMC)
EPIDURAL | Status: AC
  Filled 2018-07-13: qty 100

## 2018-07-13 MED ORDER — DIPHENHYDRAMINE HCL 25 MG PO CAPS: 25.0000 mg | ORAL_CAPSULE | Freq: Four times a day (QID) | ORAL | Status: DC | PRN

## 2018-07-13 MED ORDER — OXYTOCIN 40 UNITS IN NORMAL SALINE INFUSION - SIMPLE MED
1.0000 m[IU]/min | INTRAVENOUS | Status: DC
  Administered 2018-07-13: 2 m[IU]/min via INTRAVENOUS

## 2018-07-13 MED ORDER — LACTATED RINGERS IV BOLUS: 500.0000 mL | Freq: Once | INTRAVENOUS | Status: DC

## 2018-07-13 MED ORDER — PRENATAL MULTIVITAMIN CH
1.0000 | ORAL_TABLET | Freq: Every day | ORAL | Status: DC
  Administered 2018-07-14 – 2018-07-15 (×2): 1 via ORAL
  Filled 2018-07-13 (×3): qty 1

## 2018-07-13 MED ORDER — SODIUM CHLORIDE (PF) 0.9 % IJ SOLN
INTRAMUSCULAR | Status: AC
  Filled 2018-07-13: qty 50

## 2018-07-13 MED ORDER — TERBUTALINE SULFATE 1 MG/ML IJ SOLN: 0.2500 mg | Freq: Once | INTRAMUSCULAR | Status: DC | PRN

## 2018-07-13 NOTE — Progress Notes (Signed)
Intrapartum Progress Note   S: Patient feeling uncomfortable, desires epidural.   O: Blood pressure 133/82, pulse 89, temperature 97.8 F (36.6 C), temperature source Oral, resp. rate 18, height 5\' 5"  (1.651 m), weight 121.6 kg, last menstrual period 10/05/2017, SpO2 100 %. Gen App: NAD,  Mildly uncomfortable Abdomen: soft, gravid FHT: baseline 140 bpm.  Accels present.  Decels occasional subtle late decelerations.. moderate in degree variability.   Tocometer: contractions q 2-4 minutes Cervix: 4/60/-2/AROM @ 1:00 pm with clear fluid Extremities: Nontender, no edema.  Pitocin: 8 mIU  Labs: No new labs   Assessment:  1: SIUP at [redacted]w[redacted]d 2. Morbid obesity  Plan:  1. Continue Pitocin for augmentation.  2. Will notify anesthesia of patient's desire for an epidural.  3. Continue to monitor fetal tracing. Currently reassuring.  IUPC placed to better monitor contractions   Hildred Laser, MD 07/13/2018 5:17 PM

## 2018-07-13 NOTE — Plan of Care (Signed)
Discussed plan of care with patient. Patient verbalized understanding.

## 2018-07-13 NOTE — Anesthesia Preprocedure Evaluation (Signed)
Anesthesia Evaluation  Patient identified by MRN, date of birth, ID band Patient awake    Reviewed: Allergy & Precautions, H&P , NPO status , Patient's Chart, lab work & pertinent test results, reviewed documented beta blocker date and time   History of Anesthesia Complications Negative for: history of anesthetic complications  Airway Mallampati: I  TM Distance: >3 FB Neck ROM: full    Dental no notable dental hx.    Pulmonary neg shortness of breath, asthma , neg recent URI,           Cardiovascular Exercise Tolerance: Good negative cardio ROS       Neuro/Psych  Headaches, neg Seizures  Neuromuscular disease negative psych ROS   GI/Hepatic negative GI ROS, Neg liver ROS,   Endo/Other  neg diabetesMorbid obesity  Renal/GU negative Renal ROS  negative genitourinary   Musculoskeletal   Abdominal   Peds  Hematology negative hematology ROS (+)   Anesthesia Other Findings Past Medical History: No date: Asthma No date: Migraines   Reproductive/Obstetrics (+) Pregnancy                             Anesthesia Physical Anesthesia Plan  ASA: III  Anesthesia Plan: Epidural   Post-op Pain Management:    Induction:   PONV Risk Score and Plan:   Airway Management Planned:   Additional Equipment:   Intra-op Plan:   Post-operative Plan:   Informed Consent: I have reviewed the patients History and Physical, chart, labs and discussed the procedure including the risks, benefits and alternatives for the proposed anesthesia with the patient or authorized representative who has indicated his/her understanding and acceptance.     Dental Advisory Given  Plan Discussed with: Anesthesiologist, CRNA and Surgeon  Anesthesia Plan Comments:         Anesthesia Quick Evaluation

## 2018-07-13 NOTE — Progress Notes (Signed)
Intrapartum Progress Note   S: Patient comfortable, laying on left side. No complaints.  O: Blood pressure 120/71, pulse 81, temperature 97.8 F (36.6 C), temperature source Oral, resp. rate 16, height 5\' 5"  (1.651 m), weight 121.6 kg, last menstrual period 10/05/2017, SpO2 100 %. Gen App: NAD,  comfortable Abdomen: soft, gravid FHT: baseline 140 bpm.  Accels present.  Decels mixed variable and late decelerations from baseline to . moderate in degree variability.   Tocometer: contractions q 2-3 minutes Cervix: 5/80/-1/AROM @ 1:00 pm with clear fluid Extremities: Nontender, no edema.   Pitocin: 8 mIU  Labs: No new labs   Assessment:  1: SIUP at [redacted]w[redacted]d 2. Morbid obesity  Plan:  1. Continue Pitocin for augmentation. Slow increase.  2. Will begin amnioinfusion.   3. Category II tracing   Hildred Laser, MD 07/13/2018 7:50 PM

## 2018-07-13 NOTE — Telephone Encounter (Signed)
Spoke with pt via mychart

## 2018-07-13 NOTE — Progress Notes (Signed)
Madisan Sullivan is a 33 y.o. G2P0010 at [redacted]w[redacted]d admitted for induction of labor due to Obesity.  Subjective: Pt "uncomfortable" with contractions.  Objective: BP 113/65 (BP Location: Right Arm)   Pulse 83   Temp 98 F (36.7 C) (Oral)   Resp 18   Ht 5\' 5"  (1.651 m)   Wt 121.6 kg   LMP 10/05/2017 (Exact Date)   SpO2 100%   BMI 44.60 kg/m  No intake/output data recorded. No intake/output data recorded.  FHT: Occasional decelerations through the night.  Also good variability and accelerations.  UC:   Q2-8 min.  Mild to palpation SVE:   Dilation: 1 Effacement (%): Thick Station: -3 Exam by:: [facility] RN  (Dr Logan Bores)  Labs: Lab Results  Component Value Date   WBC 14.1 (H) 07/12/2018   HGB 12.6 07/12/2018   HCT 37.4 07/12/2018   MCV 79.4 (L) 07/12/2018   PLT 185 07/12/2018    Assessment / Plan: Minimal cervical change with Misoprostol.   3rd dose placed ( ) Decelerations and lack of progress discussed with patient - questions answered.    Megan Sullivan 07/13/2018, 7:56 AM

## 2018-07-13 NOTE — Progress Notes (Signed)
Intrapartum Progress Note   S: Patient feeling some discomfort with contractions, but not unbearable. Declines medication at this time.   O: Blood pressure 125/82, pulse 80, temperature 98.5 F (36.9 C), temperature source Oral, resp. rate 18, height 5\' 5"  (1.651 m), weight 121.6 kg, last menstrual period 10/05/2017, SpO2 100 %. Gen App: NAD,  Mildly uncomfortable with contractions Abdomen: soft, gravid FHT: baseline 140 bpm.  Accels present.  Decels absent. moderate in degree variability.   Tocometer: contractions q 2-3 minutes Cervix: 4/60/-3 Extremities: Nontender, no edema.  Pitocin: None  Labs: No new labs   Assessment:  1: SIUP at [redacted]w[redacted]d 2. Morbid obesity  Plan:  1. Continue induction Foley bulb out.  Will start Pitocin.  2. Continue to monitor fetal tracing. Currently reassuring.    Hildred Laser, MD 07/13/2018 12:44 PM

## 2018-07-13 NOTE — Anesthesia Procedure Notes (Signed)
Epidural Patient location during procedure: OB Start time: 07/13/2018 5:58 PM End time: 07/13/2018 6:04 PM  Staffing Anesthesiologist: Lenard Simmer, MD Performed: anesthesiologist   Preanesthetic Checklist Completed: patient identified, site marked, surgical consent, pre-op evaluation, timeout performed, IV checked, risks and benefits discussed and monitors and equipment checked  Epidural Patient position: sitting Prep: ChloraPrep Patient monitoring: heart rate, continuous pulse ox and blood pressure Approach: midline Location: L3-L4 Injection technique: LOR saline  Needle:  Needle type: Tuohy  Needle gauge: 17 G Needle length: 9 cm and 9 Needle insertion depth: 7 cm Catheter type: closed end flexible Catheter size: 19 Gauge Catheter at skin depth: 12 cm Test dose: negative and 1.5% lidocaine with Epi 1:200 K  Assessment Sensory level: T10 Events: blood not aspirated, injection not painful, no injection resistance, negative IV test and no paresthesia  Additional Notes 1st attempt Pt. Evaluated and documentation done after procedure finished. Patient identified. Risks/Benefits/Options discussed with patient including but not limited to bleeding, infection, nerve damage, paralysis, failed block, incomplete pain control, headache, blood pressure changes, nausea, vomiting, reactions to medication both or allergic, itching and postpartum back pain. Confirmed with bedside nurse the patient's most recent platelet count. Confirmed with patient that they are not currently taking any anticoagulation, have any bleeding history or any family history of bleeding disorders. Patient expressed understanding and wished to proceed. All questions were answered. Sterile technique was used throughout the entire procedure. Please see nursing notes for vital signs. Test dose was given through epidural catheter and negative prior to continuing to dose epidural or start infusion. Warning signs of high  block given to the patient including shortness of breath, tingling/numbness in hands, complete motor block, or any concerning symptoms with instructions to call for help. Patient was given instructions on fall risk and not to get out of bed. All questions and concerns addressed with instructions to call with any issues or inadequate analgesia.   Patient tolerated the insertion well without immediate complications.Reason for block:procedure for pain

## 2018-07-13 NOTE — Progress Notes (Signed)
Intrapartum Progress Note   Assumed care from Dr. Brennan Bailey this morning.   S: Patient feeling some discomfort with contractions, but not unbearable. Declines medication at this time.   O: Blood pressure 113/65, pulse 83, temperature 98 F (36.7 C), temperature source Oral, resp. rate 18, height 5\' 5"  (1.651 m), weight 121.6 kg, last menstrual period 10/05/2017, SpO2 100 %. Gen App: NAD,  Mildly uncomfortable Abdomen: soft, gravid FHT: baseline 140 bpm.  Accels present.  Decels occasional subtle deceleration.. moderate in degree variability.   Tocometer: contractions q 2-3 minutes Cervix: 1/30/-3 Extremities: Nontender, no edema.  Pitocin: None  Labs: No new labs   Assessment:  1: SIUP at [redacted]w[redacted]d 2. Morbid obesity  Plan:  1. Continue induction with Cytotec.  Is s/p 3rd dose this morning.  Foley bulb placed.  2. Continue to monitor fetal tracing. Currently reassuring.    Hildred Laser, MD 07/13/2018 8:18 AM

## 2018-07-14 LAB — CBC
HCT: 35 % — ABNORMAL LOW (ref 36.0–46.0)
Hemoglobin: 11.8 g/dL — ABNORMAL LOW (ref 12.0–15.0)
MCH: 27.5 pg (ref 26.0–34.0)
MCHC: 33.7 g/dL (ref 30.0–36.0)
MCV: 81.6 fL (ref 80.0–100.0)
Platelets: 173 10*3/uL (ref 150–400)
RBC: 4.29 MIL/uL (ref 3.87–5.11)
RDW: 17.2 % — ABNORMAL HIGH (ref 11.5–15.5)
WBC: 20.8 10*3/uL — ABNORMAL HIGH (ref 4.0–10.5)

## 2018-07-14 MED ORDER — SIMETHICONE 80 MG PO CHEW: 80.0000 mg | CHEWABLE_TABLET | ORAL | Status: DC | PRN

## 2018-07-14 MED ORDER — DIBUCAINE (PERIANAL) 1 % EX OINT: 1.0000 "application " | TOPICAL_OINTMENT | CUTANEOUS | Status: DC | PRN

## 2018-07-14 MED ORDER — WITCH HAZEL-GLYCERIN EX PADS: 1.0000 "application " | MEDICATED_PAD | CUTANEOUS | Status: DC | PRN

## 2018-07-14 MED ORDER — BENZOCAINE-MENTHOL 20-0.5 % EX AERO
1.0000 "application " | INHALATION_SPRAY | CUTANEOUS | Status: DC | PRN
  Administered 2018-07-14: 1 via TOPICAL
  Filled 2018-07-14: qty 56

## 2018-07-14 MED ORDER — OXYTOCIN 40 UNITS IN NORMAL SALINE INFUSION - SIMPLE MED
INTRAVENOUS | Status: AC
  Filled 2018-07-14: qty 1000

## 2018-07-14 MED ORDER — ZOLPIDEM TARTRATE 5 MG PO TABS: 5.0000 mg | ORAL_TABLET | Freq: Every evening | ORAL | Status: DC | PRN

## 2018-07-14 MED ORDER — SENNOSIDES-DOCUSATE SODIUM 8.6-50 MG PO TABS
2.0000 | ORAL_TABLET | ORAL | Status: DC
  Administered 2018-07-14 – 2018-07-15 (×2): 2 via ORAL
  Filled 2018-07-14 (×3): qty 2

## 2018-07-14 NOTE — Anesthesia Postprocedure Evaluation (Signed)
Anesthesia Post Note  Patient: Megan Sullivan  Procedure(s) Performed: AN AD HOC LABOR EPIDURAL  Patient location during evaluation: Mother Baby Anesthesia Type: Epidural Level of consciousness: awake and alert Pain management: pain level controlled Vital Signs Assessment: post-procedure vital signs reviewed and stable Respiratory status: spontaneous breathing, nonlabored ventilation and respiratory function stable Cardiovascular status: stable Postop Assessment: no headache, no backache and epidural receding Anesthetic complications: no     Last Vitals:  Vitals:   07/14/18 0319 07/14/18 0734  BP: 134/79 123/81  Pulse: (!) 108 (!) 114  Resp: 16 18  Temp: 37.1 C 36.8 C  SpO2: 99% 100%    Last Pain:  Vitals:   07/14/18 0734  TempSrc: Oral  PainSc:                  [name]

## 2018-07-14 NOTE — Progress Notes (Signed)
Post Partum Day # 1, s/p SVD  Subjective: no complaints, up ad lib, voiding and tolerating PO  Objective: Temp:  [97.8 F (36.6 C)-99.7 F (37.6 C)] 98.3 F (36.8 C) (06/04 0734) Pulse Rate:  [77-114] 114 (06/04 0734) Resp:  [16-20] 18 (06/04 0734) BP: (111-146)/(67-97) 123/81 (06/04 0734) SpO2:  [97 %-100 %] 100 % (06/04 0734)  Physical Exam:  General: alert and no distress  Lungs: clear to auscultation bilaterally Breasts: normal appearance, no masses or tenderness Heart: regular rate and rhythm, S1, S2 normal, no murmur, click, rub or gallop Abdomen: soft, non-tender; bowel sounds normal; no masses,  no organomegaly Pelvis: Lochia: appropriate, Uterine Fundus: firm Extremities: DVT Evaluation: No evidence of DVT seen on physical exam. Negative Homan's sign. No cords or calf tenderness. No significant calf/ankle edema.  Recent Labs    07/12/18 1617  HGB 12.6  HCT 37.4    Assessment/Plan: Breastfeeding, Lactation consult and Contraception undecided  Encourage ambulation Pending CBC at 10 a.m. Discharge home in a.m.    LOS: 2 days   Hildred Laser, MD [facility] 07/14/2018 7:44 AM

## 2018-07-15 ENCOUNTER — Ambulatory Visit: Payer: Self-pay

## 2018-07-15 NOTE — Lactation Note (Signed)
This note was copied from a baby's chart. Lactation Consultation Note  Patient Name: Megan Sullivan Today's Date: 07/15/2018     Maternal Data  Mom has sl flat nipples, large breasts, baby can latch without shield with sandwiching breast, but can't maintain latch because the wt of the breast causes baby to loose latch and baby comes off, mom had been using 20 mm nipple shield, but I gave a 24 mm shield because of larger nipple and this is more comfortable for mom     Feeding  BAby latched well with nipple shield and nursed easily with swallows heard, recommend continuing use of nipple shield until milk production increases and baby gains more wt and strength.    LATCH Score                   Interventions  Recommend pumping after some feedings once she receives her breast pump to increase breast stimulation and milk production  Lactation Tools Discussed/Used  Medela nipple shield instruction sheet given to mom, and instructions of weaning baby from shield     Consult Status  Call for assistance for weaning from shield if needed.   Dyann Kief 07/15/2018, 8:08 PM

## 2018-07-15 NOTE — Plan of Care (Signed)
Vs stable; up ad lib; tolerating regular diet; taking motrin and tylenol for pain control; probable discharge today

## 2018-07-15 NOTE — Progress Notes (Signed)
Patient discharged home with infant. Discharge instructions given and reviewed with patient. Patient verbalized understanding. Will be Escorted out by staff.

## 2018-07-15 NOTE — Discharge Instructions (Signed)
Discharge Instructions:   Follow-up Appointment: Schedule follow-up postpartum visit in 6 weeks.   If there are any new medications, they have been ordered and will be available for pickup at the listed pharmacy on your way home from the hospital.   Call office if you have any of the following: headache, visual changes, fever >101.0 F, chills, shortness of breath, breast concerns, excessive vaginal bleeding, incision drainage or problems, leg pain or redness, depression or any other concerns. If you have vaginal discharge with an odor, let your doctor know.   It is normal to bleed for up to 6 weeks. You should not soak through more than 1 pad in 1 hour. If you have a blood clot larger than your fist with continued bleeding, call your doctor.   Activity: Do not lift > 10 lbs for 6 weeks (do not lift anything heavier than your baby). No intercourse, tampons, swimming pools, hot tubs, baths (only showers) for 6 weeks.  No driving for 1-2 weeks. Continue prenatal vitamin, especially if breastfeeding. Increase calories and fluids (water) while breastfeeding.   Your milk will come in, in the next couple of days (right now it is colostrum). You may have a slight fever when your milk comes in, but it should go away on its own.  If it does not, and rises above 101 F please call the doctor. You will also feel achy and your breasts will be firm. They will also start to leak. If you are breastfeeding, continue as you have been and you can pump/express milk for comfort.   If you have too much milk, your breasts can become engorged, which could lead to mastitis. This is an infection of the milk ducts. It can be very painful and you will need to notify your doctor to obtain a prescription for antibiotics. You can also treat it with a shower or hot/cold compress.   For concerns about your baby, please call your pediatrician.  For breastfeeding concerns, the lactation consultant can be reached at [phone].     Postpartum blues (feelings of happy one minute and sad another minute) are normal for the first few weeks but if it gets worse let your doctor know.   Congratulations! We enjoyed caring for you and your new bundle of joy!

## 2018-07-17 NOTE — Discharge Summary (Signed)
                              Discharge Summary  Date of Admission: 07/12/2018  Date of Discharge: 07/17/2018  Admitting Diagnosis: Induction of labor at [redacted]w[redacted]d for obesity  Mode of Delivery: normal spontaneous vaginal delivery                 Discharge Diagnosis: No other diagnosis   Intrapartum Procedures: Atificial rupture of membranes, epidural, GBS prophylaxis and pitocin augmentation Foley bulb    Post partum procedures:   Complications: none                      Discharge Day SOAP Note:  Progress Note - Vaginal Delivery  Megan Sullivan is a 33 y.o. [id number] now PP day 2 s/p Vaginal, Spontaneous . Delivery was complicated by obesity  Subjective  The patient has the following complaints: has no unusual complaints  Pain is controlled with current medications.   Patient is urinating without difficulty.  She is ambulating well.    Objective  Vital signs: BP 117/81 (BP Location: Right Arm)   Pulse 88   Temp 98.6 F (37 C) (Oral)   Resp 18   Ht 5\' 5"  (1.651 m)   Wt 121.6 kg   LMP 10/05/2017 (Exact Date)   SpO2 98% Comment: Room Air  Breastfeeding Unknown   BMI 44.60 kg/m   Physical Exam: Gen: NAD Fundus Fundal Tone: Firm  Lochia Amount: Scant  Perineum Appearance: Intact     Data Review Labs: CBC Latest Ref Rng & Units 07/14/2018 07/12/2018 04/14/2018  WBC 4.0 - 10.5 K/uL 20.8(H) 14.1(H) 10.0  Hemoglobin 12.0 - 15.0 g/dL 11.8(L) 12.6 12.0  Hematocrit 36.0 - 46.0 % 35.0(L) 37.4 36.7  Platelets 150 - 400 K/uL 173 185 181   O POS  Assessment/Plan  Active Problems:   Pregnancy complicated by obesity    Plan for discharge today.   Discharge Instructions: Per After Visit Summary. Activity: Advance as tolerated. Pelvic rest for 6 weeks.  Also refer to After Visit Summary Diet: Regular Medications: Allergies as of 07/15/2018   No Known Allergies     Medication List    TAKE these medications   acetaminophen 500 MG tablet Commonly known as:  TYLENOL Take  500 mg by mouth every 6 (six) hours as needed (1-2 tablets once daily PRN).   prenatal multivitamin Tabs tablet Take 1 tablet by mouth daily at 12 noon.      Outpatient follow up:  Follow-up Information    Rubie Maid, MD. Schedule an appointment as soon as possible for a visit in 6 week(s).   Specialties:  Obstetrics and Gynecology, Radiology Why:  Schedule appointment for follow-up postpartum visit in 6 weeks.  Contact information: [address] [place] [address] [phone]          Postpartum contraception: Will discuss at first office visit post-partum  Discharged Condition: good  Discharged to: home  Newborn Data: Disposition:home with mother  Apgars: APGAR (1 MIN): 8   APGAR (5 MINS): 9   APGAR (10 MINS):    Baby Feeding: Breast   Finis Bud, M.D. 07/17/2018 8:46 AM

## 2018-07-25 DIAGNOSIS — Z3400 Encounter for supervision of normal first pregnancy, unspecified trimester: Secondary | ICD-10-CM | POA: Diagnosis not present

## 2018-08-24 ENCOUNTER — Telehealth: Payer: Self-pay

## 2018-08-24 NOTE — Telephone Encounter (Signed)
[facility]/sent mychart for prescreening.

## 2018-08-25 ENCOUNTER — Encounter: Payer: Self-pay | Admitting: Obstetrics and Gynecology

## 2018-08-25 ENCOUNTER — Ambulatory Visit (INDEPENDENT_AMBULATORY_CARE_PROVIDER_SITE_OTHER): Payer: 59 | Admitting: Obstetrics and Gynecology

## 2018-08-25 ENCOUNTER — Other Ambulatory Visit: Payer: Self-pay

## 2018-08-25 NOTE — Progress Notes (Signed)
Subjective:     Megan Sullivan is a 33 y.o. female who presents for a postpartum visit. She is 6 weeks postpartum following a [redacted]w[redacted]d spontaneus vaginal birth with epidural. Baby is doing well. Mom took her to pediatrician yesterday due to spitting up, but she's doing fine now. Mom has good support from mom and husband; they've been staying at home. Baby feeds every 2- 3 hours during the day, and then eats at midnight - she is steeping until 6am. Baby is gaining weight appropriately. Pt is healing well, possibly some scar tissue from her tear but no bleeding. She was very irritated down there for awhile, but no longer. No spotting- seems like the bleeding stopped about a week ago. Pt is not having sex yet. She isn't totally sure if she's ready for another baby, but is learning towards no birth control; pt and her husband would be okay with another pregnancy soon. Still breastfeeding- initially had to use shield, but now baby is latching with no issues. Pt does note still feeling some residual lower abd tenderness during some movements. Feels like a sore muscle, no pain.   Review of Systems See HPI.  Objective:    BP 102/76   Pulse 80   Ht 5\' 5"  (1.651 m)   Wt 113.4 kg   Breastfeeding Yes   BMI 41.62 kg/m   General:  Well appearing   Breasts:  inspection negative, no nipple discharge or bleeding, no masses or nodularity palpable  Lungs: clear to auscultation bilaterally  Heart:  regular rate and rhythm, S1, S2 normal, no murmur, click, rub or gallop  Abdomen: soft, non-tender; bowel sounds normal; no masses,  no organomegaly   Vulva:  normal  Vagina: vagina positive for vaginal tear/laceration that is healing well  Cervix:  present  Corpus: normal  Adnexa:  normal adnexa  Rectal Exam: Not performed.        Assessment:   Edinburgh depression scale zero today.    postpartum exam. Pap smear done 12/2017, and was normal.   Plan:    1. Contraception: currently abstinent, considering OCP.  Urine hCG not performed today due to pt not having intercourse yet.   Follow up in: 4 months for annual visit.  Marin Roberts, PA-S 08/25/18

## 2018-08-25 NOTE — Progress Notes (Signed)
   OBSTETRICS POSTPARTUM CLINIC PROGRESS NOTE  Subjective:     Megan Sullivan is a 33 y.o. G36P1011 female who presents for a postpartum visit. She is 6 weeks postpartum following a spontaneous vaginal delivery. I have fully reviewed the prenatal and intrapartum course. The delivery was at 62 gestational weeks, induced for obesity in pregnancy.  Anesthesia: epidural. Postpartum course has been well. Baby's course has been overall well. Baby is feeding by breast. Bleeding: patient has/has not resumed menses, with No LMP recorded.. Bowel function is normal. Bladder function is normal. Patient is not sexually active. Contraception method desired is unsure, but thinks that she may decline contraception. Postpartum depression screening: negative (EPDS score = 0).  The following portions of the patient's history were reviewed and updated as appropriate: allergies, current medications, past family history, past medical history, past social history, past surgical history and problem list.  Review of Systems A comprehensive review of systems was negative except for: Genitourinary: positive for feeling a knot in vaginal area where her stitches were   Objective:    BP 102/76   Pulse 80   Ht 5\' 5"  (1.651 m)   Wt 250 lb 1.6 oz (113.4 kg)   Breastfeeding Yes   BMI 41.62 kg/m   General:  alert and no distress   Breasts:  inspection negative, no nipple discharge or bleeding, no masses or nodularity palpable  Lungs: clear to auscultation bilaterally  Heart:  regular rate and rhythm, S1, S2 normal, no murmur, click, rub or gallop  Abdomen: soft, non-tender; bowel sounds normal; no masses,  no organomegaly.     Vulva:  normal. 0.5-1 cm wide area of superficial separation at perineum where previous repair was, granulation tissue present  Vagina: normal vagina, no discharge, exudate, lesion, or erythema  Cervix:  no cervical motion tenderness and no lesions  Corpus: normal size, contour, position,  consistency, mobility, non-tender  Adnexa:  normal adnexa and no mass, fullness, tenderness  Rectal Exam: Not performed.         Labs:  Lab Results  Component Value Date   HGB 11.8 (L) 07/14/2018     Assessment:   1. Postpartum care following vaginal delivery 2. Breastfeeding    Plan:    1. Contraception: undecided, initially considered pills but now considering no contraception. Desires to discuss with her husband. Given handout on options.  2. Can resume all normal activities. Advised on pelvic rest for 1 additional week to allow for additional healing of perineum.  3. Continued to encourage breast feeding.  4. Follow up in: 4 months for annual exam, or sooner as needed.    Rubie Maid, MD [facility]

## 2018-08-25 NOTE — Progress Notes (Signed)
  PT is present today for her postpartum visit. Pt stated that she is breastfeeding and have not had sexually intercourse recently. Pt stated that she would like to get the pills for birth control, but is unsure right now. EPDS= 0.  Pt stated that she is doing well no complaints.

## 2018-08-25 NOTE — Patient Instructions (Signed)
Contraception Choices Contraception, also called birth control, refers to methods or devices that prevent pregnancy. Hormonal methods Contraceptive implant  A contraceptive implant is a thin, plastic tube that contains a hormone. It is inserted into the upper part of the arm. It can remain in place for up to 3 years. Progestin-only injections Progestin-only injections are injections of progestin, a synthetic form of the hormone progesterone. They are given every 3 months by a health care provider. Birth control pills  Birth control pills are pills that contain hormones that prevent pregnancy. They must be taken once a day, preferably at the same time each day. Birth control patch  The birth control patch contains hormones that prevent pregnancy. It is placed on the skin and must be changed once a week for three weeks and removed on the fourth week. A prescription is needed to use this method of contraception. Vaginal ring  A vaginal ring contains hormones that prevent pregnancy. It is placed in the vagina for three weeks and removed on the fourth week. After that, the process is repeated with a new ring. A prescription is needed to use this method of contraception. Emergency contraceptive Emergency contraceptives prevent pregnancy after unprotected sex. They come in pill form and can be taken up to 5 days after sex. They work best the sooner they are taken after having sex. Most emergency contraceptives are available without a prescription. This method should not be used as your only form of birth control. Barrier methods Female condom  A female condom is a thin sheath that is worn over the penis during sex. Condoms keep sperm from going inside a woman's body. They can be used with a spermicide to increase their effectiveness. They should be disposed after a single use. Female condom  A female condom is a soft, loose-fitting sheath that is put into the vagina before sex. The condom keeps sperm  from going inside a woman's body. They should be disposed after a single use. Diaphragm  A diaphragm is a soft, dome-shaped barrier. It is inserted into the vagina before sex, along with a spermicide. The diaphragm blocks sperm from entering the uterus, and the spermicide kills sperm. A diaphragm should be left in the vagina for 6-8 hours after sex and removed within 24 hours. A diaphragm is prescribed and fitted by a health care provider. A diaphragm should be replaced every 1-2 years, after giving birth, after gaining more than 15 lb (6.8 kg), and after pelvic surgery. Cervical cap  A cervical cap is a round, soft latex or plastic cup that fits over the cervix. It is inserted into the vagina before sex, along with spermicide. It blocks sperm from entering the uterus. The cap should be left in place for 6-8 hours after sex and removed within 48 hours. A cervical cap must be prescribed and fitted by a health care provider. It should be replaced every 2 years. Sponge  A sponge is a soft, circular piece of polyurethane foam with spermicide on it. The sponge helps block sperm from entering the uterus, and the spermicide kills sperm. To use it, you make it wet and then insert it into the vagina. It should be inserted before sex, left in for at least 6 hours after sex, and removed and thrown away within 30 hours. Spermicides Spermicides are chemicals that kill or block sperm from entering the cervix and uterus. They can come as a cream, jelly, suppository, foam, or tablet. A spermicide should be inserted into the   vagina with an applicator at least 10-15 minutes before sex to allow time for it to work. The process must be repeated every time you have sex. Spermicides do not require a prescription. Intrauterine contraception Intrauterine device (IUD) An IUD is a T-shaped device that is put in a woman's uterus. There are two types:  Hormone IUD.This type contains progestin, a synthetic form of the hormone  progesterone. This type can stay in place for 3-5 years.  Copper IUD.This type is wrapped in copper wire. It can stay in place for 10 years.  Permanent methods of contraception Female tubal ligation In this method, a woman's fallopian tubes are sealed, tied, or blocked during surgery to prevent eggs from traveling to the uterus. Hysteroscopic sterilization In this method, a small, flexible insert is placed into each fallopian tube. The inserts cause scar tissue to form in the fallopian tubes and block them, so sperm cannot reach an egg. The procedure takes about 3 months to be effective. Another form of birth control must be used during those 3 months. Female sterilization This is a procedure to tie off the tubes that carry sperm (vasectomy). After the procedure, the man can still ejaculate fluid (semen). Natural planning methods Natural family planning In this method, a couple does not have sex on days when the woman could become pregnant. Calendar method This means keeping track of the length of each menstrual cycle, identifying the days when pregnancy can happen, and not having sex on those days. Ovulation method In this method, a couple avoids sex during ovulation. Symptothermal method This method involves not having sex during ovulation. The woman typically checks for ovulation by watching changes in her temperature and in the consistency of cervical mucus. Post-ovulation method In this method, a couple waits to have sex until after ovulation. Summary  Contraception, also called birth control, means methods or devices that prevent pregnancy.  Hormonal methods of contraception include implants, injections, pills, patches, vaginal rings, and emergency contraceptives.  Barrier methods of contraception can include female condoms, female condoms, diaphragms, cervical caps, sponges, and spermicides.  There are two types of IUDs (intrauterine devices). An IUD can be put in a woman's uterus to  prevent pregnancy for 3-5 years.  Permanent sterilization can be done through a procedure for males, females, or both.  Natural family planning methods involve not having sex on days when the woman could become pregnant. This information is not intended to replace advice given to you by your health care provider. Make sure you discuss any questions you have with your health care provider. Document Released: 01/26/2005 Document Revised: 01/28/2017 Document Reviewed: 02/29/2016 Elsevier Patient Education  2020 [employer]  

## 2018-12-27 ENCOUNTER — Other Ambulatory Visit: Payer: Self-pay | Admitting: Obstetrics and Gynecology

## 2018-12-27 ENCOUNTER — Ambulatory Visit (INDEPENDENT_AMBULATORY_CARE_PROVIDER_SITE_OTHER): Payer: 59 | Admitting: Obstetrics and Gynecology

## 2018-12-27 ENCOUNTER — Encounter: Payer: Self-pay | Admitting: Obstetrics and Gynecology

## 2018-12-27 ENCOUNTER — Other Ambulatory Visit: Payer: Self-pay

## 2018-12-27 VITALS — BP 113/74 | HR 74 | Ht 65.0 in | Wt 260.7 lb

## 2018-12-27 DIAGNOSIS — Z13220 Encounter for screening for lipoid disorders: Secondary | ICD-10-CM | POA: Diagnosis not present

## 2018-12-27 DIAGNOSIS — Z131 Encounter for screening for diabetes mellitus: Secondary | ICD-10-CM

## 2018-12-27 DIAGNOSIS — Z01419 Encounter for gynecological examination (general) (routine) without abnormal findings: Secondary | ICD-10-CM | POA: Diagnosis not present

## 2018-12-27 DIAGNOSIS — Z6841 Body Mass Index (BMI) 40.0 and over, adult: Secondary | ICD-10-CM | POA: Diagnosis not present

## 2018-12-27 NOTE — Progress Notes (Signed)
Pt present for annual exam. Pt stated having pain in the labia area. No other problems. Flu vaccine completed 10/25/18.

## 2018-12-27 NOTE — Progress Notes (Signed)
GYNECOLOGY ANNUAL PHYSICAL EXAM PROGRESS NOTE  Subjective:    Megan Sullivan is a 33 y.o. G48P1011 married female who presents for an annual exam. The patient has no complaints today. The patient is sexually active. The patient wears seatbelts: yes. The patient participates in regular exercise: no. Has the patient ever been transfused or tattooed?: no. The patient reports that there is not domestic violence in her life.    Gynecologic History  Menarche age: 61 Patient's last menstrual period was 12/18/2018. Contraception: none History of STI's: Denies Last Pap: 12/21/2017. Results were: normal.  Denies h/o abnormal pap smears.   OB History  Gravida Para Term Preterm AB Living  2 1 1  0 1 1  SAB TAB Ectopic Multiple Live Births  0 0 0 0 1    # Outcome Date GA Lbr Len/2nd Weight Sex Delivery Anes PTL Lv  2 Term 07/13/18 [redacted]w[redacted]d / 00:18 6 lb 13.4 oz (3.1 kg) F Vag-Spont EPI  LIV     Name: [redacted]w[redacted]d     Apgar1: 8  Apgar5: 9  1 AB             Past Medical History:  Diagnosis Date  . Asthma   . Migraines     Past Surgical History:  Procedure Laterality Date  . DILATION AND CURETTAGE OF UTERUS  2018  . OVARIAN CYST REMOVAL  2017    Family History  Problem Relation Age of Onset  . Hyperlipidemia Father   . Diabetes Maternal Grandmother   . Healthy Mother   . Breast cancer Neg Hx   . Ovarian cancer Neg Hx   . Colon cancer Neg Hx     Social History   Socioeconomic History  . Marital status: Married    Spouse name: Not on file  . Number of children: Not on file  . Years of education: Not on file  . Highest education level: Not on file  Occupational History  . Not on file  Social Needs  . Financial resource strain: Not on file  . Food insecurity    Worry: Not on file    Inability: Not on file  . Transportation needs    Medical: Not on file    Non-medical: Not on file  Tobacco Use  . Smoking status: Never Smoker  . Smokeless tobacco: Never Used   Substance and Sexual Activity  . Alcohol use: Never    Frequency: Never  . Drug use: Never  . Sexual activity: Not Currently    Birth control/protection: None  Lifestyle  . Physical activity    Days per week: 0 days    Minutes per session: 0 min  . Stress: Not at all  Relationships  . Social 2018 on phone: Three times a week    Gets together: Once a week    Attends religious service: 1 to 4 times per year    Active member of club or organization: No    Attends meetings of clubs or organizations: Never    Relationship status: Married  . Intimate partner violence    Fear of current or ex partner: No    Emotionally abused: No    Physically abused: No    Forced sexual activity: No  Other Topics Concern  . Not on file  Social History Narrative  . Not on file    Current Outpatient Medications on File Prior to Visit  Medication Sig Dispense Refill  . Prenatal Vit-Fe Fumarate-FA (  PRENATAL MULTIVITAMIN) TABS tablet Take 1 tablet by mouth daily at 12 noon.     No current facility-administered medications on file prior to visit.     No Known Allergies    Review of Systems Constitutional: negative for chills, fatigue, fevers and sweats Eyes: negative for irritation, redness and visual disturbance Ears, nose, mouth, throat, and face: negative for hearing loss, nasal congestion, snoring and tinnitus Respiratory: negative for asthma, cough, sputum Cardiovascular: negative for chest pain, dyspnea, exertional chest pressure/discomfort, irregular heart beat, palpitations and syncope Gastrointestinal: negative for abdominal pain, change in bowel habits, nausea and vomiting Genitourinary: negative for abnormal menstrual periods, genital lesions, sexual problems and vaginal discharge, dysuria and urinary incontinence Integument/breast: negative for breast lump, breast tenderness and nipple discharge Hematologic/lymphatic: negative for bleeding and easy bruising  Musculoskeletal:negative for back pain and muscle weakness Neurological: negative for dizziness, headaches, vertigo and weakness Endocrine: negative for diabetic symptoms including polydipsia, polyuria and skin dryness Allergic/Immunologic: negative for hay fever and urticaria        Objective:  Blood pressure 113/74, pulse 74, height 5\' 5"  (1.651 m), weight 260 lb 11.2 oz (118.3 kg), last menstrual period 12/18/2018, currently breastfeeding. Body mass index is 43.38 kg/m.  General Appearance:    Alert, cooperative, no distress, appears stated age, morbid obesity  Head:    Normocephalic, without obvious abnormality, atraumatic  Eyes:    PERRL, conjunctiva/corneas clear, EOM's intact, both eyes  Ears:    Normal external ear canals, both ears  Nose:   Nares normal, septum midline, mucosa normal, no drainage or sinus tenderness  Throat:   Lips, mucosa, and tongue normal; teeth and gums normal  Neck:   Supple, symmetrical, trachea midline, no adenopathy; thyroid: no enlargement/tenderness/nodules; no carotid bruit or JVD  Back:     Symmetric, no curvature, ROM normal, no CVA tenderness  Lungs:     Clear to auscultation bilaterally, respirations unlabored  Chest Wall:    No tenderness or deformity   Heart:    Regular rate and rhythm, S1 and S2 normal, no murmur, rub or gallop  Breast Exam:    No tenderness, masses, or nipple abnormality  Abdomen:     Soft, non-tender, bowel sounds active all four quadrants, no masses, no organomegaly.    Genitalia:    Pelvic:external genitalia normal, vagina without lesions, discharge, or tenderness, rectovaginal septum  normal. Cervix normal in appearance, no cervical motion tenderness, no adnexal masses or tenderness.  Uterus normal size, shape, mobile, regular contours, nontender.  Rectal:    Normal external sphincter.  No hemorrhoids appreciated. Internal exam not done.   Extremities:   Extremities normal, atraumatic, no cyanosis or edema  Pulses:   2+ and  symmetric all extremities  Skin:   Skin color, texture, turgor normal, no rashes or lesions  Lymph nodes:   Cervical, supraclavicular, and axillary nodes normal  Neurologic:   CNII-XII intact, normal strength, sensation and reflexes throughout   .  Labs:  Lab Results  Component Value Date   WBC 20.8 (H) 07/14/2018   HGB 11.8 (L) 07/14/2018   HCT 35.0 (L) 07/14/2018   MCV 81.6 07/14/2018   PLT 173 07/14/2018    No results found for: CREATININE, BUN, NA, K, CL, CO2  No results found for: ALT, AST, GGT, ALKPHOS, BILITOT  Lab Results  Component Value Date   TSH 1.040 12/03/2017     Assessment:   1. Encounter for well woman exam with routine gynecological exam   2. Screening  for diabetes mellitus   3. Screening for lipoid disorders   4. Morbid obesity with BMI of 40.0-44.9, adult (HCC)     Plan:     Blood tests: CBC with diff, Comprehensive metabolic panel, Lipoproteins and TSH. Breast self exam technique reviewed and patient encouraged to perform self-exam monthly. Contraception: none. Discussed healthy lifestyle modifications. Pap smear up to date.   Received flu vaccine in September.  Follow up in 1 year for annual exam.     Hildred Laserherry, Keva Darty, MD [facility]

## 2018-12-27 NOTE — Patient Instructions (Addendum)
Preventive Care 21-31 Years Old, Female Preventive care refers to visits with your health care provider and lifestyle choices that can promote health and wellness. This includes:  A yearly physical exam. This may also be called an annual well check.  Regular dental visits and eye exams.  Immunizations.  Screening for certain conditions.  Healthy lifestyle choices, such as eating a healthy diet, getting regular exercise, not using drugs or products that contain nicotine and tobacco, and limiting alcohol use. What can I expect for my preventive care visit? Physical exam Your health care provider will check your:  Height and weight. This may be used to calculate body mass index (BMI), which tells if you are at a healthy weight.  Heart rate and blood pressure.  Skin for abnormal spots. Counseling Your health care provider may ask you questions about your:  Alcohol, tobacco, and drug use.  Emotional well-being.  Home and relationship well-being.  Sexual activity.  Eating habits.  Work and work environment.  Method of birth control.  Menstrual cycle.  Pregnancy history. What immunizations do I need?  Influenza (flu) vaccine  This is recommended every year. Tetanus, diphtheria, and pertussis (Tdap) vaccine  You may need a Td booster every 10 years. Varicella (chickenpox) vaccine  You may need this if you have not been vaccinated. Human papillomavirus (HPV) vaccine  If recommended by your health care provider, you may need three doses over 6 months. Measles, mumps, and rubella (MMR) vaccine  You may need at least one dose of MMR. You may also need a second dose. Meningococcal conjugate (MenACWY) vaccine  One dose is recommended if you are age 19-21 years and a first-year college student living in a residence hall, or if you have one of several medical conditions. You may also need additional booster doses. Pneumococcal conjugate (PCV13) vaccine  You may need  this if you have certain conditions and were not previously vaccinated. Pneumococcal polysaccharide (PPSV23) vaccine  You may need one or two doses if you smoke cigarettes or if you have certain conditions. Hepatitis A vaccine  You may need this if you have certain conditions or if you travel or work in places where you may be exposed to hepatitis A. Hepatitis B vaccine  You may need this if you have certain conditions or if you travel or work in places where you may be exposed to hepatitis B. Haemophilus influenzae type b (Hib) vaccine  You may need this if you have certain conditions. You may receive vaccines as individual doses or as more than one vaccine together in one shot (combination vaccines). Talk with your health care provider about the risks and benefits of combination vaccines. What tests do I need?  Blood tests  Lipid and cholesterol levels. These may be checked every 5 years starting at age 20.  Hepatitis C test.  Hepatitis B test. Screening  Diabetes screening. This is done by checking your blood sugar (glucose) after you have not eaten for a while (fasting).  Sexually transmitted disease (STD) testing.  BRCA-related cancer screening. This may be done if you have a family history of breast, ovarian, tubal, or peritoneal cancers.  Pelvic exam and Pap test. This may be done every 3 years starting at age 21. Starting at age 30, this may be done every 5 years if you have a Pap test in combination with an HPV test. Talk with your health care provider about your test results, treatment options, and if necessary, the need for more tests.   Follow these instructions at home: °Eating and drinking ° °· Eat a diet that includes fresh fruits and vegetables, whole grains, lean protein, and low-fat dairy. °· Take vitamin and mineral supplements as recommended by your health care provider. °· Do not drink alcohol if: °? Your health care provider tells you not to drink. °? You are  pregnant, may be pregnant, or are planning to become pregnant. °· If you drink alcohol: °? Limit how much you have to 0-1 drink a day. °? Be aware of how much alcohol is in your drink. In the [place] one drink equals one 12 oz bottle of beer (355 mL), one 5 oz glass of wine (148 mL), or one 1½ oz glass of hard liquor (44 mL). °Lifestyle °· Take daily care of your teeth and gums. °· Stay active. Exercise for at least 30 minutes on 5 or more days each week. °· Do not use any products that contain nicotine or tobacco, such as cigarettes, e-cigarettes, and chewing tobacco. If you need help quitting, ask your health care provider. °· If you are sexually active, practice safe sex. Use a condom or other form of birth control (contraception) in order to prevent pregnancy and STIs (sexually transmitted infections). If you plan to become pregnant, see your health care provider for a preconception visit. °What's next? °· Visit your health care provider once a year for a well check visit. °· Ask your health care provider how often you should have your eyes and teeth checked. °· Stay up to date on all vaccines. °This information is not intended to replace advice given to you by your health care provider. Make sure you discuss any questions you have with your health care provider. °Document Released: 03/24/2001 Document Revised: 10/07/2017 Document Reviewed: 10/07/2017 °Elsevier Patient Education © 2020 [employer] °Breast Self-Awareness °Breast self-awareness is knowing how your breasts look and feel. Doing breast self-awareness is important. It allows you to catch a breast problem early while it is still small and can be treated. All women should do breast self-awareness, including women who have had breast implants. Tell your doctor if you notice a change in your breasts. °What you need: °· A mirror. °· A well-lit room. °How to do a breast self-exam °A breast self-exam is one way to learn what is normal for your breasts and to  check for changes. To do a breast self-exam: °Look for changes ° °1. Take off all the clothes above your waist. °2. Stand in front of a mirror in a room with good lighting. °3. Put your hands on your hips. °4. Push your hands down. °5. Look at your breasts and nipples in the mirror to see if one breast or nipple looks different from the other. Check to see if: °? The shape of one breast is different. °? The size of one breast is different. °? There are wrinkles, dips, and bumps in one breast and not the other. °6. Look at each breast for changes in the skin, such as: °? Redness. °? Scaly areas. °7. Look for changes in your nipples, such as: °? Liquid around the nipples. °? Bleeding. °? Dimpling. °? Redness. °? A change in where the nipples are. °Feel for changes ° °1. Lie on your back on the floor. °2. Feel each breast. To do this, follow these steps: °? Pick a breast to feel. °? Put the arm closest to that breast above your head. °? Use your other arm to feel the nipple area of   your breast. Feel the area with the pads of your three middle fingers by making small circles with your fingers. For the first circle, press lightly. For the second circle, press harder. For the third circle, press even harder. ? Keep making circles with your fingers at the different pressures as you move down your breast. Stop when you feel your ribs. ? Move your fingers a little toward the center of your body. ? Start making circles with your fingers again, this time going up until you reach your collarbone. ? Keep making up-and-down circles until you reach your armpit. Remember to keep using the three pressures. ? Feel the other breast in the same way. 3. Sit or stand in the tub or shower. 4. With soapy water on your skin, feel each breast the same way you did in step 2 when you were lying on the floor. Write down what you find Writing down what you find can help you remember what to tell your doctor. Write down:  What is  normal for each breast.  Any changes you find in each breast, including: ? The kind of changes you find. ? Whether you have pain. ? Size and location of any lumps.  When you last had your menstrual period. General tips  Check your breasts every month.  If you are breastfeeding, the best time to check your breasts is after you feed your baby or after you use a breast pump.  If you get menstrual periods, the best time to check your breasts is 5-7 days after your menstrual period is over.  With time, you will become comfortable with the self-exam, and you will begin to know if there are changes in your breasts. Contact a doctor if you:  See a change in the shape or size of your breasts or nipples.  See a change in the skin of your breast or nipples, such as red or scaly skin.  Have fluid coming from your nipples that is not normal.  Find a lump or thick area that was not there before.  Have pain in your breasts.  Have any concerns about your breast health. Summary  Breast self-awareness includes looking for changes in your breasts, as well as feeling for changes within your breasts.  Breast self-awareness should be done in front of a mirror in a well-lit room.  You should check your breasts every month. If you get menstrual periods, the best time to check your breasts is 5-7 days after your menstrual period is over.  Let your doctor know of any changes you see in your breasts, including changes in size, changes on the skin, pain or tenderness, or fluid from your nipples that is not normal. This information is not intended to replace advice given to you by your health care provider. Make sure you discuss any questions you have with your health care provider. Document Released: 07/15/2007 Document Revised: 09/14/2017 Document Reviewed: 09/14/2017 Elsevier Patient Education  2020 [place] 26-44 Years Old, Female Preventive care refers to visits with  your health care provider and lifestyle choices that can promote health and wellness. This includes:  A yearly physical exam. This may also be called an annual well check.  Regular dental visits and eye exams.  Immunizations.  Screening for certain conditions.  Healthy lifestyle choices, such as eating a healthy diet, getting regular exercise, not using drugs or products that contain nicotine and tobacco, and limiting alcohol use. What can I  expect for my preventive care visit? Physical exam Your health care provider will check your:  Height and weight. This may be used to calculate body mass index (BMI), which tells if you are at a healthy weight.  Heart rate and blood pressure.  Skin for abnormal spots. Counseling Your health care provider may ask you questions about your:  Alcohol, tobacco, and drug use.  Emotional well-being.  Home and relationship well-being.  Sexual activity.  Eating habits.  Work and work Statistician.  Method of birth control.  Menstrual cycle.  Pregnancy history. What immunizations do I need?  Influenza (flu) vaccine  This is recommended every year. Tetanus, diphtheria, and pertussis (Tdap) vaccine  You may need a Td booster every 10 years. Varicella (chickenpox) vaccine  You may need this if you have not been vaccinated. Human papillomavirus (HPV) vaccine  If recommended by your health care provider, you may need three doses over 6 months. Measles, mumps, and rubella (MMR) vaccine  You may need at least one dose of MMR. You may also need a second dose. Meningococcal conjugate (MenACWY) vaccine  One dose is recommended if you are age 66-21 years and a first-year college student living in a residence hall, or if you have one of several medical conditions. You may also need additional booster doses. Pneumococcal conjugate (PCV13) vaccine  You may need this if you have certain conditions and were not previously vaccinated.  Pneumococcal polysaccharide (PPSV23) vaccine  You may need one or two doses if you smoke cigarettes or if you have certain conditions. Hepatitis A vaccine  You may need this if you have certain conditions or if you travel or work in places where you may be exposed to hepatitis A. Hepatitis B vaccine  You may need this if you have certain conditions or if you travel or work in places where you may be exposed to hepatitis B. Haemophilus influenzae type b (Hib) vaccine  You may need this if you have certain conditions. You may receive vaccines as individual doses or as more than one vaccine together in one shot (combination vaccines). Talk with your health care provider about the risks and benefits of combination vaccines. What tests do I need?  Blood tests  Lipid and cholesterol levels. These may be checked every 5 years starting at age 38.  Hepatitis C test.  Hepatitis B test. Screening  Diabetes screening. This is done by checking your blood sugar (glucose) after you have not eaten for a while (fasting).  Sexually transmitted disease (STD) testing.  BRCA-related cancer screening. This may be done if you have a family history of breast, ovarian, tubal, or peritoneal cancers.  Pelvic exam and Pap test. This may be done every 3 years starting at age 74. Starting at age 58, this may be done every 5 years if you have a Pap test in combination with an HPV test. Talk with your health care provider about your test results, treatment options, and if necessary, the need for more tests. Follow these instructions at home: Eating and drinking   Eat a diet that includes fresh fruits and vegetables, whole grains, lean protein, and low-fat dairy.  Take vitamin and mineral supplements as recommended by your health care provider.  Do not drink alcohol if: ? Your health care provider tells you not to drink. ? You are pregnant, may be pregnant, or are planning to become pregnant.  If you  drink alcohol: ? Limit how much you have to 0-1 drink a day. ?  Be aware of how much alcohol is in your drink. In the [place] one drink equals one 12 oz bottle of beer (355 mL), one 5 oz glass of wine (148 mL), or one 1 oz glass of hard liquor (44 mL). Lifestyle  Take daily care of your teeth and gums.  Stay active. Exercise for at least 30 minutes on 5 or more days each week.  Do not use any products that contain nicotine or tobacco, such as cigarettes, e-cigarettes, and chewing tobacco. If you need help quitting, ask your health care provider.  If you are sexually active, practice safe sex. Use a condom or other form of birth control (contraception) in order to prevent pregnancy and STIs (sexually transmitted infections). If you plan to become pregnant, see your health care provider for a preconception visit. What's next?  Visit your health care provider once a year for a well check visit.  Ask your health care provider how often you should have your eyes and teeth checked.  Stay up to date on all vaccines. This information is not intended to replace advice given to you by your health care provider. Make sure you discuss any questions you have with your health care provider. Document Released: 03/24/2001 Document Revised: 10/07/2017 Document Reviewed: 10/07/2017 Elsevier Patient Education  2020 [employer].

## 2018-12-28 LAB — TSH: TSH: 2.35 u[IU]/mL (ref 0.450–4.500)

## 2018-12-28 LAB — CBC
Hematocrit: 40.6 % (ref 34.0–46.6)
Hemoglobin: 13.5 g/dL (ref 11.1–15.9)
MCH: 27.7 pg (ref 26.6–33.0)
MCHC: 33.3 g/dL (ref 31.5–35.7)
MCV: 83 fL (ref 79–97)
Platelets: 228 10*3/uL (ref 150–450)
RBC: 4.87 x10E6/uL (ref 3.77–5.28)
RDW: 14.7 % (ref 11.7–15.4)
WBC: 6.9 10*3/uL (ref 3.4–10.8)

## 2018-12-28 LAB — LIPID PANEL
Chol/HDL Ratio: 2.6 ratio (ref 0.0–4.4)
Cholesterol, Total: 148 mg/dL (ref 100–199)
HDL: 56 mg/dL (ref >39)
LDL Chol Calc (NIH): 73 mg/dL (ref 0–99)
Triglycerides: 103 mg/dL (ref 0–149)
VLDL Cholesterol Cal: 19 mg/dL (ref 5–40)

## 2018-12-28 LAB — COMPREHENSIVE METABOLIC PANEL
ALT: 18 IU/L (ref 0–32)
AST: 21 IU/L (ref 0–40)
Albumin/Globulin Ratio: 1.4 (ref 1.2–2.2)
Albumin: 4.2 g/dL (ref 3.8–4.8)
Alkaline Phosphatase: 66 IU/L (ref 39–117)
BUN/Creatinine Ratio: 12 (ref 9–23)
BUN: 12 mg/dL (ref 6–20)
Bilirubin Total: 0.6 mg/dL (ref 0.0–1.2)
CO2: 22 mmol/L (ref 20–29)
Calcium: 9.4 mg/dL (ref 8.7–10.2)
Chloride: 102 mmol/L (ref 96–106)
Creatinine, Ser: 1 mg/dL (ref 0.57–1.00)
GFR calc Af Amer: 87 mL/min/{1.73_m2} (ref >59)
GFR calc non Af Amer: 75 mL/min/{1.73_m2} (ref >59)
Globulin, Total: 3.1 g/dL (ref 1.5–4.5)
Glucose: 77 mg/dL (ref 65–99)
Potassium: 4.2 mmol/L (ref 3.5–5.2)
Sodium: 141 mmol/L (ref 134–144)
Total Protein: 7.3 g/dL (ref 6.0–8.5)

## 2018-12-28 LAB — HEMOGLOBIN A1C
Est. average glucose Bld gHb Est-mCnc: 103 mg/dL
Hgb A1c MFr Bld: 5.2 % (ref 4.8–5.6)

## 2019-01-16 DIAGNOSIS — S40012D Contusion of left shoulder, subsequent encounter: Secondary | ICD-10-CM | POA: Diagnosis not present

## 2019-01-16 DIAGNOSIS — S7002XA Contusion of left hip, initial encounter: Secondary | ICD-10-CM | POA: Diagnosis not present

## 2019-01-16 DIAGNOSIS — S0093XA Contusion of unspecified part of head, initial encounter: Secondary | ICD-10-CM | POA: Diagnosis not present

## 2019-01-16 DIAGNOSIS — S40012A Contusion of left shoulder, initial encounter: Secondary | ICD-10-CM | POA: Diagnosis not present

## 2019-01-16 DIAGNOSIS — S0093XD Contusion of unspecified part of head, subsequent encounter: Secondary | ICD-10-CM | POA: Diagnosis not present

## 2019-01-16 DIAGNOSIS — S7002XD Contusion of left hip, subsequent encounter: Secondary | ICD-10-CM | POA: Diagnosis not present

## 2019-02-08 ENCOUNTER — Telehealth: Payer: Self-pay

## 2019-02-08 DIAGNOSIS — Z0289 Encounter for other administrative examinations: Secondary | ICD-10-CM

## 2019-02-08 NOTE — Telephone Encounter (Signed)
Husband company offers paternity leave, within 1 year of the child being born. Paperwork was left in our office on yest. Husband plans to take leave at the end of Jan. Needs paperwork filed

## 2019-02-09 NOTE — Telephone Encounter (Signed)
Completed.

## 2019-02-21 ENCOUNTER — Telehealth: Payer: Self-pay

## 2019-02-21 NOTE — Telephone Encounter (Signed)
Pt called to see I f paperwork was completed for her husband. I let patient know the nurse msg on 12/31 said the info was completed. Patient will come by for pick-up after 3pm

## 2019-02-23 NOTE — Telephone Encounter (Signed)
Form completed and husband picked up 02/21/19

## 2019-04-16 DIAGNOSIS — Z23 Encounter for immunization: Secondary | ICD-10-CM | POA: Diagnosis not present

## 2019-12-28 ENCOUNTER — Ambulatory Visit (INDEPENDENT_AMBULATORY_CARE_PROVIDER_SITE_OTHER): Payer: 59 | Admitting: Obstetrics and Gynecology

## 2019-12-28 ENCOUNTER — Other Ambulatory Visit: Payer: Self-pay

## 2019-12-28 ENCOUNTER — Encounter: Payer: Self-pay | Admitting: Obstetrics and Gynecology

## 2019-12-28 VITALS — BP 125/85 | HR 90 | Ht 65.0 in | Wt 267.1 lb

## 2019-12-28 DIAGNOSIS — Z13220 Encounter for screening for lipoid disorders: Secondary | ICD-10-CM

## 2019-12-28 DIAGNOSIS — Z131 Encounter for screening for diabetes mellitus: Secondary | ICD-10-CM

## 2019-12-28 DIAGNOSIS — Z01419 Encounter for gynecological examination (general) (routine) without abnormal findings: Secondary | ICD-10-CM | POA: Diagnosis not present

## 2019-12-28 DIAGNOSIS — Z6841 Body Mass Index (BMI) 40.0 and over, adult: Secondary | ICD-10-CM | POA: Diagnosis not present

## 2019-12-28 NOTE — Progress Notes (Signed)
Pt present for annual exam. Pt stated Last pap 12/21/2017 results negative. Pt stated that she was doing well no problems.

## 2019-12-28 NOTE — Progress Notes (Signed)
GYNECOLOGY ANNUAL PHYSICAL EXAM PROGRESS NOTE  Subjective:    Megan Sullivan is a 34 y.o. G31P1011 married female who presents for an annual exam. The patient has no complaints today. The patient is sexually active.  The patient wears seatbelts: yes. The patient participates in regular exercise: no. Patient would like to begin working on healthier diet and regular exercise. The patient is open to pregnancy, not preventing but not actively trying to conceive. Patient reports she is still breastfeeding her daughter (for comfort). The patient reports periods are regular, though lighter and slightly shorter in duration since giving birth.    Menstrual History:  Menarche age: 47 Patient's last menstrual period was 12/24/2019. Period Duration (Days): 5 Period Pattern: Regular Menstrual Flow: Moderate, Heavy Menstrual Control: Tampon, Thin pad Menstrual Control Change Freq (Hours): 2-3 Dysmenorrhea: (!) Moderate Dysmenorrhea Symptoms: Cramping   Gynecology History:  Contraception: none History of STI's: Denies Last Pap: 12/21/17. Results were: normal.  Denies h/o abnormal pap smears.   Upstream - 12/31/19 1430      Pregnancy Intention Screening   Does the patient want to become pregnant in the next year? Ok Either Way    Does the patient's partner want to become pregnant in the next year? Ok Either Way    Would the patient like to discuss contraceptive options today? No      Contraception Wrap Up   Current Method No Contraceptive Precautions    End Method No Contraception Precautions    Contraception Counseling Provided No           The pregnancy intention screening data noted above was reviewed. Potential methods of contraception were discussed. The patient elected to proceed with No Method - No Contraceptive Precautions.    OB History  Gravida Para Term Preterm AB Living  2 1 1  0 1 1  SAB TAB Ectopic Multiple Live Births  0 0 0 0 1    # Outcome Date GA Lbr Len/2nd  Weight Sex Delivery Anes PTL Lv  2 Term 07/13/18 [redacted]w[redacted]d / 00:18 6 lb 13.4 oz (3.1 kg) F Vag-Spont EPI  LIV     Name: [redacted]w[redacted]d     Apgar1: 8  Apgar5: 9  1 AB             Past Medical History:  Diagnosis Date  . Asthma   . Migraines     Past Surgical History:  Procedure Laterality Date  . DILATION AND CURETTAGE OF UTERUS  2018  . OVARIAN CYST REMOVAL  2017    Family History  Problem Relation Age of Onset  . Hyperlipidemia Father   . Diabetes Maternal Grandmother   . Healthy Mother   . Breast cancer Neg Hx   . Ovarian cancer Neg Hx   . Colon cancer Neg Hx     Social History   Socioeconomic History  . Marital status: Married    Spouse name: Not on file  . Number of children: Not on file  . Years of education: Not on file  . Highest education level: Not on file  Occupational History  . Not on file  Tobacco Use  . Smoking status: Never Smoker  . Smokeless tobacco: Never Used  Vaping Use  . Vaping Use: Never used  Substance and Sexual Activity  . Alcohol use: Never  . Drug use: Never  . Sexual activity: Yes    Birth control/protection: None  Other Topics Concern  . Not on file  Social History Narrative  .  Not on file   Social Determinants of Health   Financial Resource Strain:   . Difficulty of Paying Living Expenses: Not on file  Food Insecurity:   . Worried About Programme researcher, broadcasting/film/video in the Last Year: Not on file  . Ran Out of Food in the Last Year: Not on file  Transportation Needs:   . Lack of Transportation (Medical): Not on file  . Lack of Transportation (Non-Medical): Not on file  Physical Activity:   . Days of Exercise per Week: Not on file  . Minutes of Exercise per Session: Not on file  Stress:   . Feeling of Stress : Not on file  Social Connections:   . Frequency of Communication with Friends and Family: Not on file  . Frequency of Social Gatherings with Friends and Family: Not on file  . Attends Religious Services: Not on file    . Active Member of Clubs or Organizations: Not on file  . Attends Banker Meetings: Not on file  . Marital Status: Not on file  Intimate Partner Violence:   . Fear of Current or Ex-Partner: Not on file  . Emotionally Abused: Not on file  . Physically Abused: Not on file  . Sexually Abused: Not on file    Current Outpatient Medications on File Prior to Visit  Medication Sig Dispense Refill  . acetaminophen (TYLENOL) 500 MG tablet Take 500 mg by mouth every 6 (six) hours as needed for headache.     No current facility-administered medications on file prior to visit.    No Known Allergies    Review of Systems Constitutional: negative for chills, fatigue, fevers and sweats Eyes: negative for irritation, redness and visual disturbance Ears, nose, mouth, throat, and face: negative for hearing loss, nasal congestion, snoring and tinnitus Respiratory: negative for asthma, cough, sputum Cardiovascular: negative for chest pain, dyspnea, exertional chest pressure/discomfort, irregular heart beat, palpitations and syncope Gastrointestinal: negative for abdominal pain, change in bowel habits, nausea and vomiting Genitourinary: negative for abnormal menstrual periods, genital lesions, sexual problems and vaginal discharge, dysuria and urinary incontinence Integument/breast: negative for breast lump, breast tenderness and nipple discharge Hematologic/lymphatic: negative for bleeding and easy bruising Musculoskeletal:negative for back pain and muscle weakness Neurological: negative for dizziness, headaches, vertigo and weakness Endocrine: negative for diabetic symptoms including polydipsia, polyuria and skin dryness Allergic/Immunologic: negative for hay fever and urticaria        Objective:  Blood pressure 125/85, pulse 90, height 5\' 5"  (1.651 m), weight 267 lb 1.6 oz (121.2 kg), last menstrual period 12/24/2019, currently breastfeeding. Body mass index is 44.45 kg/m.      General Appearance:    Alert, cooperative, no distress, appears stated age  Head:    Normocephalic, without obvious abnormality, atraumatic  Eyes:    PERRL, conjunctiva/corneas clear, EOM's intact, both eyes  Ears:    Normal external ear canals, both ears  Nose:   Nares normal, septum midline, mucosa normal, no drainage or sinus tenderness  Throat:   Lips, mucosa, and tongue normal; teeth and gums normal  Neck:   Supple, symmetrical, trachea midline, no adenopathy; thyroid: no enlargement/tenderness/nodules; no carotid bruit or JVD  Back:     Symmetric, no curvature, ROM normal, no CVA tenderness  Lungs:     Clear to auscultation bilaterally, respirations unlabored  Chest Wall:    No tenderness or deformity   Heart:    Regular rate and rhythm, S1 and S2 normal, no murmur, rub or gallop  Breast Exam:    No tenderness, masses, or nipple abnormality  Abdomen:     Soft, non-tender, bowel sounds active all four quadrants, no masses, no organomegaly.    Genitalia:    Pelvic:external genitalia normal, vagina without lesions, discharge, or tenderness, rectovaginal septum  normal. Cervix normal in appearance, no cervical motion tenderness, no adnexal masses or tenderness.  Uterus normal size, shape, mobile, regular contours, nontender.  Rectal:    Normal external sphincter.  No hemorrhoids appreciated. Internal exam not done.   Extremities:   Extremities normal, atraumatic, no cyanosis or edema  Pulses:   2+ and symmetric all extremities  Skin:   Skin color, texture, turgor normal, no rashes or lesions  Lymph nodes:   Cervical, supraclavicular, and axillary nodes normal  Neurologic:   CNII-XII intact, normal strength, sensation and reflexes throughout   .  Labs:  Lab Results  Component Value Date   WBC 6.9 12/27/2018   HGB 13.5 12/27/2018   HCT 40.6 12/27/2018   MCV 83 12/27/2018   PLT 228 12/27/2018    Lab Results  Component Value Date   CREATININE 1.00 12/27/2018   BUN 12 12/27/2018    NA 141 12/27/2018   K 4.2 12/27/2018   CL 102 12/27/2018   CO2 22 12/27/2018    Lab Results  Component Value Date   ALT 18 12/27/2018   AST 21 12/27/2018   ALKPHOS 66 12/27/2018   BILITOT 0.6 12/27/2018    Lab Results  Component Value Date   TSH 2.350 12/27/2018     Assessment:   1. Encounter for well woman exam with routine gynecological exam   2. Screening for lipoid disorders   3. Morbid obesity with BMI of 40.0-44.9, adult (HCC)   4. Screening for diabetes mellitus    Plan:    Blood tests: see orders. Breast self exam technique reviewed and patient encouraged to perform self-exam monthly. Contraception: none. If pregnancy considered, to encourage initiation of prenatal vitamins.  Discussed healthy lifestyle modifications. Pap smear up to date. Due in 1 year.  COVID vaccination status:has completed vaccination series.  Can receive booster when eligible.  Flu vaccine up to date.  Follow up in 1 year for annual exam, or sooner if needed.    Hildred Laser, MD [facility]

## 2019-12-28 NOTE — Patient Instructions (Signed)
Preventive Care 21-32 Years Old, Female Preventive care refers to visits with your health care provider and lifestyle choices that can promote health and wellness. This includes:  A yearly physical exam. This may also be called an annual well check.  Regular dental visits and eye exams.  Immunizations.  Screening for certain conditions.  Healthy lifestyle choices, such as eating a healthy diet, getting regular exercise, not using drugs or products that contain nicotine and tobacco, and limiting alcohol use. What can I expect for my preventive care visit? Physical exam Your health care provider will check your:  Height and weight. This may be used to calculate body mass index (BMI), which tells if you are at a healthy weight.  Heart rate and blood pressure.  Skin for abnormal spots. Counseling Your health care provider may ask you questions about your:  Alcohol, tobacco, and drug use.  Emotional well-being.  Home and relationship well-being.  Sexual activity.  Eating habits.  Work and work environment.  Method of birth control.  Menstrual cycle.  Pregnancy history. What immunizations do I need?  Influenza (flu) vaccine  This is recommended every year. Tetanus, diphtheria, and pertussis (Tdap) vaccine  You may need a Td booster every 10 years. Varicella (chickenpox) vaccine  You may need this if you have not been vaccinated. Human papillomavirus (HPV) vaccine  If recommended by your health care provider, you may need three doses over 6 months. Measles, mumps, and rubella (MMR) vaccine  You may need at least one dose of MMR. You may also need a second dose. Meningococcal conjugate (MenACWY) vaccine  One dose is recommended if you are age 19-21 years and a first-year college student living in a residence hall, or if you have one of several medical conditions. You may also need additional booster doses. Pneumococcal conjugate (PCV13) vaccine  You may need  this if you have certain conditions and were not previously vaccinated. Pneumococcal polysaccharide (PPSV23) vaccine  You may need one or two doses if you smoke cigarettes or if you have certain conditions. Hepatitis A vaccine  You may need this if you have certain conditions or if you travel or work in places where you may be exposed to hepatitis A. Hepatitis B vaccine  You may need this if you have certain conditions or if you travel or work in places where you may be exposed to hepatitis B. Haemophilus influenzae type b (Hib) vaccine  You may need this if you have certain conditions. You may receive vaccines as individual doses or as more than one vaccine together in one shot (combination vaccines). Talk with your health care provider about the risks and benefits of combination vaccines. What tests do I need?  Blood tests  Lipid and cholesterol levels. These may be checked every 5 years starting at age 20.  Hepatitis C test.  Hepatitis B test. Screening  Diabetes screening. This is done by checking your blood sugar (glucose) after you have not eaten for a while (fasting).  Sexually transmitted disease (STD) testing.  BRCA-related cancer screening. This may be done if you have a family history of breast, ovarian, tubal, or peritoneal cancers.  Pelvic exam and Pap test. This may be done every 3 years starting at age 21. Starting at age 30, this may be done every 5 years if you have a Pap test in combination with an HPV test. Talk with your health care provider about your test results, treatment options, and if necessary, the need for more tests.   Follow these instructions at home: Eating and drinking   Eat a diet that includes fresh fruits and vegetables, whole grains, lean protein, and low-fat dairy.  Take vitamin and mineral supplements as recommended by your health care provider.  Do not drink alcohol if: ? Your health care provider tells you not to drink. ? You are  pregnant, may be pregnant, or are planning to become pregnant.  If you drink alcohol: ? Limit how much you have to 0-1 drink a day. ? Be aware of how much alcohol is in your drink. In the [place] one drink equals one 12 oz bottle of beer (355 mL), one 5 oz glass of wine (148 mL), or one 1 oz glass of hard liquor (44 mL). Lifestyle  Take daily care of your teeth and gums.  Stay active. Exercise for at least 30 minutes on 5 or more days each week.  Do not use any products that contain nicotine or tobacco, such as cigarettes, e-cigarettes, and chewing tobacco. If you need help quitting, ask your health care provider.  If you are sexually active, practice safe sex. Use a condom or other form of birth control (contraception) in order to prevent pregnancy and STIs (sexually transmitted infections). If you plan to become pregnant, see your health care provider for a preconception visit. What's next?  Visit your health care provider once a year for a well check visit.  Ask your health care provider how often you should have your eyes and teeth checked.  Stay up to date on all vaccines. This information is not intended to replace advice given to you by your health care provider. Make sure you discuss any questions you have with your health care provider. Document Revised: 10/07/2017 Document Reviewed: 10/07/2017 Elsevier Patient Education  2020 [employer] Breast Self-Awareness Breast self-awareness is knowing how your breasts look and feel. Doing breast self-awareness is important. It allows you to catch a breast problem early while it is still small and can be treated. All women should do breast self-awareness, including women who have had breast implants. Tell your doctor if you notice a change in your breasts. What you need:  A mirror.  A well-lit room. How to do a breast self-exam A breast self-exam is one way to learn what is normal for your breasts and to check for changes. To do a  breast self-exam: Look for changes  1. Take off all the clothes above your waist. 2. Stand in front of a mirror in a room with good lighting. 3. Put your hands on your hips. 4. Push your hands down. 5. Look at your breasts and nipples in the mirror to see if one breast or nipple looks different from the other. Check to see if: ? The shape of one breast is different. ? The size of one breast is different. ? There are wrinkles, dips, and bumps in one breast and not the other. 6. Look at each breast for changes in the skin, such as: ? Redness. ? Scaly areas. 7. Look for changes in your nipples, such as: ? Liquid around the nipples. ? Bleeding. ? Dimpling. ? Redness. ? A change in where the nipples are. Feel for changes  1. Lie on your back on the floor. 2. Feel each breast. To do this, follow these steps: ? Pick a breast to feel. ? Put the arm closest to that breast above your head. ? Use your other arm to feel the nipple area of your breast. Feel   the area with the pads of your three middle fingers by making small circles with your fingers. For the first circle, press lightly. For the second circle, press harder. For the third circle, press even harder. ? Keep making circles with your fingers at the different pressures as you move down your breast. Stop when you feel your ribs. ? Move your fingers a little toward the center of your body. ? Start making circles with your fingers again, this time going up until you reach your collarbone. ? Keep making up-and-down circles until you reach your armpit. Remember to keep using the three pressures. ? Feel the other breast in the same way. 3. Sit or stand in the tub or shower. 4. With soapy water on your skin, feel each breast the same way you did in step 2 when you were lying on the floor. Write down what you find Writing down what you find can help you remember what to tell your doctor. Write down:  What is normal for each breast.  Any  changes you find in each breast, including: ? The kind of changes you find. ? Whether you have pain. ? Size and location of any lumps.  When you last had your menstrual period. General tips  Check your breasts every month.  If you are breastfeeding, the best time to check your breasts is after you feed your baby or after you use a breast pump.  If you get menstrual periods, the best time to check your breasts is 5-7 days after your menstrual period is over.  With time, you will become comfortable with the self-exam, and you will begin to know if there are changes in your breasts. Contact a doctor if you:  See a change in the shape or size of your breasts or nipples.  See a change in the skin of your breast or nipples, such as red or scaly skin.  Have fluid coming from your nipples that is not normal.  Find a lump or thick area that was not there before.  Have pain in your breasts.  Have any concerns about your breast health. Summary  Breast self-awareness includes looking for changes in your breasts, as well as feeling for changes within your breasts.  Breast self-awareness should be done in front of a mirror in a well-lit room.  You should check your breasts every month. If you get menstrual periods, the best time to check your breasts is 5-7 days after your menstrual period is over.  Let your doctor know of any changes you see in your breasts, including changes in size, changes on the skin, pain or tenderness, or fluid from your nipples that is not normal. This information is not intended to replace advice given to you by your health care provider. Make sure you discuss any questions you have with your health care provider. Document Revised: 09/14/2017 Document Reviewed: 09/14/2017 Elsevier Patient Education  2020 [employer]  

## 2019-12-29 LAB — LIPID PANEL
Chol/HDL Ratio: 3.5 ratio (ref 0.0–4.4)
Cholesterol, Total: 152 mg/dL (ref 100–199)
HDL: 44 mg/dL (ref >39)
LDL Chol Calc (NIH): 86 mg/dL (ref 0–99)
Triglycerides: 122 mg/dL (ref 0–149)
VLDL Cholesterol Cal: 22 mg/dL (ref 5–40)

## 2019-12-29 LAB — COMPREHENSIVE METABOLIC PANEL
ALT: 15 IU/L (ref 0–32)
AST: 19 IU/L (ref 0–40)
Albumin/Globulin Ratio: 1.6 (ref 1.2–2.2)
Albumin: 4.7 g/dL (ref 3.8–4.8)
Alkaline Phosphatase: 60 IU/L (ref 44–121)
BUN/Creatinine Ratio: 12 (ref 9–23)
BUN: 12 mg/dL (ref 6–20)
Bilirubin Total: 0.7 mg/dL (ref 0.0–1.2)
CO2: 23 mmol/L (ref 20–29)
Calcium: 9.5 mg/dL (ref 8.7–10.2)
Chloride: 107 mmol/L — ABNORMAL HIGH (ref 96–106)
Creatinine, Ser: 1.02 mg/dL — ABNORMAL HIGH (ref 0.57–1.00)
GFR calc Af Amer: 84 mL/min/{1.73_m2} (ref >59)
GFR calc non Af Amer: 73 mL/min/{1.73_m2} (ref >59)
Globulin, Total: 3 g/dL (ref 1.5–4.5)
Glucose: 80 mg/dL (ref 65–99)
Potassium: 4 mmol/L (ref 3.5–5.2)
Sodium: 148 mmol/L — ABNORMAL HIGH (ref 134–144)
Total Protein: 7.7 g/dL (ref 6.0–8.5)

## 2019-12-29 LAB — HEMOGLOBIN A1C
Est. average glucose Bld gHb Est-mCnc: 111 mg/dL
Hgb A1c MFr Bld: 5.5 % (ref 4.8–5.6)

## 2019-12-29 LAB — CBC
Hematocrit: 39.6 % (ref 34.0–46.6)
Hemoglobin: 13.1 g/dL (ref 11.1–15.9)
MCH: 27.3 pg (ref 26.6–33.0)
MCHC: 33.1 g/dL (ref 31.5–35.7)
MCV: 83 fL (ref 79–97)
Platelets: 242 10*3/uL (ref 150–450)
RBC: 4.79 x10E6/uL (ref 3.77–5.28)
RDW: 14.6 % (ref 11.7–15.4)
WBC: 8.6 10*3/uL (ref 3.4–10.8)

## 2020-07-09 DIAGNOSIS — M5451 Vertebrogenic low back pain: Secondary | ICD-10-CM | POA: Diagnosis not present

## 2020-07-09 DIAGNOSIS — M9901 Segmental and somatic dysfunction of cervical region: Secondary | ICD-10-CM | POA: Diagnosis not present

## 2020-07-09 DIAGNOSIS — M9903 Segmental and somatic dysfunction of lumbar region: Secondary | ICD-10-CM | POA: Diagnosis not present

## 2020-07-09 DIAGNOSIS — M9904 Segmental and somatic dysfunction of sacral region: Secondary | ICD-10-CM | POA: Diagnosis not present

## 2020-07-09 DIAGNOSIS — M5413 Radiculopathy, cervicothoracic region: Secondary | ICD-10-CM | POA: Diagnosis not present

## 2020-07-09 DIAGNOSIS — M461 Sacroiliitis, not elsewhere classified: Secondary | ICD-10-CM | POA: Diagnosis not present

## 2020-07-11 DIAGNOSIS — M9904 Segmental and somatic dysfunction of sacral region: Secondary | ICD-10-CM | POA: Diagnosis not present

## 2020-07-11 DIAGNOSIS — M5451 Vertebrogenic low back pain: Secondary | ICD-10-CM | POA: Diagnosis not present

## 2020-07-11 DIAGNOSIS — M461 Sacroiliitis, not elsewhere classified: Secondary | ICD-10-CM | POA: Diagnosis not present

## 2020-07-11 DIAGNOSIS — M9903 Segmental and somatic dysfunction of lumbar region: Secondary | ICD-10-CM | POA: Diagnosis not present

## 2020-07-11 DIAGNOSIS — M5413 Radiculopathy, cervicothoracic region: Secondary | ICD-10-CM | POA: Diagnosis not present

## 2020-07-11 DIAGNOSIS — M9901 Segmental and somatic dysfunction of cervical region: Secondary | ICD-10-CM | POA: Diagnosis not present

## 2020-07-15 DIAGNOSIS — M9901 Segmental and somatic dysfunction of cervical region: Secondary | ICD-10-CM | POA: Diagnosis not present

## 2020-07-15 DIAGNOSIS — M5413 Radiculopathy, cervicothoracic region: Secondary | ICD-10-CM | POA: Diagnosis not present

## 2020-07-15 DIAGNOSIS — M9904 Segmental and somatic dysfunction of sacral region: Secondary | ICD-10-CM | POA: Diagnosis not present

## 2020-07-15 DIAGNOSIS — M9903 Segmental and somatic dysfunction of lumbar region: Secondary | ICD-10-CM | POA: Diagnosis not present

## 2020-07-15 DIAGNOSIS — M5451 Vertebrogenic low back pain: Secondary | ICD-10-CM | POA: Diagnosis not present

## 2020-07-15 DIAGNOSIS — M461 Sacroiliitis, not elsewhere classified: Secondary | ICD-10-CM | POA: Diagnosis not present

## 2020-07-18 DIAGNOSIS — M5413 Radiculopathy, cervicothoracic region: Secondary | ICD-10-CM | POA: Diagnosis not present

## 2020-07-18 DIAGNOSIS — M9901 Segmental and somatic dysfunction of cervical region: Secondary | ICD-10-CM | POA: Diagnosis not present

## 2020-07-18 DIAGNOSIS — M5451 Vertebrogenic low back pain: Secondary | ICD-10-CM | POA: Diagnosis not present

## 2020-07-18 DIAGNOSIS — M461 Sacroiliitis, not elsewhere classified: Secondary | ICD-10-CM | POA: Diagnosis not present

## 2020-07-18 DIAGNOSIS — M9904 Segmental and somatic dysfunction of sacral region: Secondary | ICD-10-CM | POA: Diagnosis not present

## 2020-07-18 DIAGNOSIS — M9903 Segmental and somatic dysfunction of lumbar region: Secondary | ICD-10-CM | POA: Diagnosis not present

## 2020-07-22 DIAGNOSIS — M461 Sacroiliitis, not elsewhere classified: Secondary | ICD-10-CM | POA: Diagnosis not present

## 2020-07-22 DIAGNOSIS — M9901 Segmental and somatic dysfunction of cervical region: Secondary | ICD-10-CM | POA: Diagnosis not present

## 2020-07-22 DIAGNOSIS — M9903 Segmental and somatic dysfunction of lumbar region: Secondary | ICD-10-CM | POA: Diagnosis not present

## 2020-07-22 DIAGNOSIS — M5413 Radiculopathy, cervicothoracic region: Secondary | ICD-10-CM | POA: Diagnosis not present

## 2020-07-22 DIAGNOSIS — M9904 Segmental and somatic dysfunction of sacral region: Secondary | ICD-10-CM | POA: Diagnosis not present

## 2020-07-22 DIAGNOSIS — M5451 Vertebrogenic low back pain: Secondary | ICD-10-CM | POA: Diagnosis not present

## 2020-07-25 DIAGNOSIS — M461 Sacroiliitis, not elsewhere classified: Secondary | ICD-10-CM | POA: Diagnosis not present

## 2020-07-25 DIAGNOSIS — M5451 Vertebrogenic low back pain: Secondary | ICD-10-CM | POA: Diagnosis not present

## 2020-07-25 DIAGNOSIS — M9901 Segmental and somatic dysfunction of cervical region: Secondary | ICD-10-CM | POA: Diagnosis not present

## 2020-07-25 DIAGNOSIS — M9904 Segmental and somatic dysfunction of sacral region: Secondary | ICD-10-CM | POA: Diagnosis not present

## 2020-07-25 DIAGNOSIS — M9903 Segmental and somatic dysfunction of lumbar region: Secondary | ICD-10-CM | POA: Diagnosis not present

## 2020-07-25 DIAGNOSIS — M5413 Radiculopathy, cervicothoracic region: Secondary | ICD-10-CM | POA: Diagnosis not present

## 2020-07-29 DIAGNOSIS — M5413 Radiculopathy, cervicothoracic region: Secondary | ICD-10-CM | POA: Diagnosis not present

## 2020-07-29 DIAGNOSIS — M9904 Segmental and somatic dysfunction of sacral region: Secondary | ICD-10-CM | POA: Diagnosis not present

## 2020-07-29 DIAGNOSIS — M461 Sacroiliitis, not elsewhere classified: Secondary | ICD-10-CM | POA: Diagnosis not present

## 2020-07-29 DIAGNOSIS — M9901 Segmental and somatic dysfunction of cervical region: Secondary | ICD-10-CM | POA: Diagnosis not present

## 2020-07-29 DIAGNOSIS — M5451 Vertebrogenic low back pain: Secondary | ICD-10-CM | POA: Diagnosis not present

## 2020-07-29 DIAGNOSIS — M9903 Segmental and somatic dysfunction of lumbar region: Secondary | ICD-10-CM | POA: Diagnosis not present

## 2020-08-05 DIAGNOSIS — M9903 Segmental and somatic dysfunction of lumbar region: Secondary | ICD-10-CM | POA: Diagnosis not present

## 2020-08-05 DIAGNOSIS — M5413 Radiculopathy, cervicothoracic region: Secondary | ICD-10-CM | POA: Diagnosis not present

## 2020-08-05 DIAGNOSIS — M5451 Vertebrogenic low back pain: Secondary | ICD-10-CM | POA: Diagnosis not present

## 2020-08-05 DIAGNOSIS — M461 Sacroiliitis, not elsewhere classified: Secondary | ICD-10-CM | POA: Diagnosis not present

## 2020-08-05 DIAGNOSIS — M9904 Segmental and somatic dysfunction of sacral region: Secondary | ICD-10-CM | POA: Diagnosis not present

## 2020-08-05 DIAGNOSIS — M9901 Segmental and somatic dysfunction of cervical region: Secondary | ICD-10-CM | POA: Diagnosis not present

## 2020-08-07 ENCOUNTER — Encounter: Payer: Self-pay | Admitting: Internal Medicine

## 2020-08-15 DIAGNOSIS — M461 Sacroiliitis, not elsewhere classified: Secondary | ICD-10-CM | POA: Diagnosis not present

## 2020-08-15 DIAGNOSIS — M9903 Segmental and somatic dysfunction of lumbar region: Secondary | ICD-10-CM | POA: Diagnosis not present

## 2020-08-15 DIAGNOSIS — M9904 Segmental and somatic dysfunction of sacral region: Secondary | ICD-10-CM | POA: Diagnosis not present

## 2020-08-15 DIAGNOSIS — M9901 Segmental and somatic dysfunction of cervical region: Secondary | ICD-10-CM | POA: Diagnosis not present

## 2020-08-15 DIAGNOSIS — M5451 Vertebrogenic low back pain: Secondary | ICD-10-CM | POA: Diagnosis not present

## 2020-08-15 DIAGNOSIS — M5413 Radiculopathy, cervicothoracic region: Secondary | ICD-10-CM | POA: Diagnosis not present

## 2020-08-28 ENCOUNTER — Ambulatory Visit: Payer: Self-pay | Admitting: Family Medicine

## 2020-11-11 DIAGNOSIS — D235 Other benign neoplasm of skin of trunk: Secondary | ICD-10-CM | POA: Diagnosis not present

## 2020-11-11 DIAGNOSIS — L720 Epidermal cyst: Secondary | ICD-10-CM | POA: Diagnosis not present

## 2020-12-30 NOTE — Progress Notes (Signed)
GYNECOLOGY ANNUAL PHYSICAL EXAM PROGRESS NOTE  Subjective:    Megan Sullivan is a 35 y.o. [id number] female who presents for an annual exam. The patient has no complaints today. The patient is sexually active.  The patient wears seatbelts: yes. The patient participates in regular exercise: no. Patient would like to begin working on healthier diet and regular exercise. The patient ever been transfused or tattooed?: no. The patient reports that there is not domestic violence in her life.    Menstrual History: Menarche age: 59 Patient's last menstrual period was 11/19/2020 (exact date). Period Duration (Days): 5-6 Period Pattern: Regular, q 30-40 days Menstrual Flow: Moderate, Light, Heavy Menstrual Control: Tampon, Maxi pad Menstrual Control Change Freq (Hours): 2-3 Dysmenorrhea: (!) Mild Dysmenorrhea Symptoms: Cramping   Gynecologic History:  Contraception: none History of STI's: Denies Last Pap: 12/21/2017. Results were: normal.  Denies h/o abnormal pap smears. Last mammogram: Not age appropriate   OB History  Gravida Para Term Preterm AB Living  2 1 1  0 1 1  SAB IAB Ectopic Multiple Live Births  0 0 0 0 1    # Outcome Date GA Lbr Len/2nd Weight Sex Delivery Anes PTL Lv  2 Term 07/13/18 [redacted]w[redacted]d / 00:18 6 lb 13.4 oz (3.1 kg) F Vag-Spont EPI  LIV     Name: Megan Sullivan     Apgar1: 8  Apgar5: 9  1 AB             Past Medical History:  Diagnosis Date   Asthma    Migraines     Past Surgical History:  Procedure Laterality Date   DILATION AND CURETTAGE OF UTERUS  2018   OVARIAN CYST REMOVAL  2017    Family History  Problem Relation Age of Onset   Hyperlipidemia Father    Diabetes Maternal Grandmother    Healthy Mother    Breast cancer Neg Hx    Ovarian cancer Neg Hx    Colon cancer Neg Hx     Social History   Socioeconomic History   Marital status: Married    Spouse name: Not on file   Number of children: Not on file   Years of education: Not on  file   Highest education level: Not on file  Occupational History   Not on file  Tobacco Use   Smoking status: Never   Smokeless tobacco: Never  Vaping Use   Vaping Use: Never used  Substance and Sexual Activity   Alcohol use: Never   Drug use: Never   Sexual activity: Yes    Birth control/protection: None  Other Topics Concern   Not on file  Social History Narrative   Not on file   Social Determinants of Health   Financial Resource Strain: Not on file  Food Insecurity: Not on file  Transportation Needs: Not on file  Physical Activity: Not on file  Stress: Not on file  Social Connections: Not on file  Intimate Partner Violence: Not on file    Current Outpatient Medications on File Prior to Visit  Medication Sig Dispense Refill   acetaminophen (TYLENOL) 500 MG tablet Take 500 mg by mouth every 6 (six) hours as needed for headache.     No current facility-administered medications on file prior to visit.    No Known Allergies   Review of Systems Constitutional: negative for chills, fatigue, fevers and sweats. Positive for intentional weight loss (35 lbs) Eyes: negative for irritation, redness and visual disturbance Ears, nose, mouth, throat,  and face: negative for hearing loss, nasal congestion, snoring and tinnitus Respiratory: negative for asthma, cough, sputum Cardiovascular: negative for chest pain, dyspnea, exertional chest pressure/discomfort, irregular heart beat, palpitations and syncope Gastrointestinal: negative for abdominal pain, change in bowel habits, nausea and vomiting Genitourinary: negative for abnormal menstrual periods, genital lesions, sexual problems and vaginal discharge, dysuria and urinary incontinence Integument/breast: negative for breast lump, breast tenderness and nipple discharge Hematologic/lymphatic: negative for bleeding and easy bruising Musculoskeletal:negative for back pain and muscle weakness Neurological: negative for dizziness,  headaches, vertigo and weakness Endocrine: negative for diabetic symptoms including polydipsia, polyuria and skin dryness Allergic/Immunologic: negative for hay fever and urticaria     Objective:  Blood pressure 128/81, pulse 83, height 5\' 5"  (1.651 m), weight 246 lb 12.8 oz (111.9 kg), last menstrual period 11/19/2020, currently breastfeeding.  Body mass index is 41.07 kg/m.  General Appearance:    Alert, cooperative, no distress, appears stated age, morbid obesity  Head:    Normocephalic, without obvious abnormality, atraumatic  Eyes:    PERRL, conjunctiva/corneas clear, EOM's intact, both eyes  Ears:    Normal external ear canals, both ears  Nose:   Nares normal, septum midline, mucosa normal, no drainage or sinus tenderness  Throat:   Lips, mucosa, and tongue normal; teeth and gums normal  Neck:   Supple, symmetrical, trachea midline, no adenopathy; thyroid: no enlargement/tenderness/nodules; no carotid bruit or JVD  Back:     Symmetric, no curvature, ROM normal, no CVA tenderness  Lungs:     Clear to auscultation bilaterally, respirations unlabored  Chest Wall:    No tenderness or deformity   Heart:    Regular rate and rhythm, S1 and S2 normal, no murmur, rub or gallop  Breast Exam:    No tenderness, masses, or nipple abnormality  Abdomen:     Soft, non-tender, bowel sounds active all four quadrants, no masses, no organomegaly.    Genitalia:    Pelvic:external genitalia normal, vagina without lesions, discharge, or tenderness, rectovaginal septum  normal. Cervix normal in appearance, no cervical motion tenderness, no adnexal masses or tenderness.  Uterus normal size, shape, mobile, regular contours, nontender.  Rectal:    Normal external sphincter.  No hemorrhoids appreciated. Internal exam not done.   Extremities:   Extremities normal, atraumatic, no cyanosis or edema  Pulses:   2+ and symmetric all extremities  Skin:   Skin color, texture, turgor normal, no rashes or lesions  Lymph  nodes:   Cervical, supraclavicular, and axillary nodes normal  Neurologic:   CNII-XII intact, normal strength, sensation and reflexes throughout   .  Labs:  Lab Results  Component Value Date   WBC 8.6 12/28/2019   HGB 13.1 12/28/2019   HCT 39.6 12/28/2019   MCV 83 12/28/2019   PLT 242 12/28/2019    Lab Results  Component Value Date   CREATININE 1.02 (H) 12/28/2019   BUN 12 12/28/2019   NA 148 (H) 12/28/2019   K 4.0 12/28/2019   CL 107 (H) 12/28/2019   CO2 23 12/28/2019    Lab Results  Component Value Date   ALT 15 12/28/2019   AST 19 12/28/2019   ALKPHOS 60 12/28/2019   BILITOT 0.7 12/28/2019    Lab Results  Component Value Date   TSH 2.350 12/27/2018    Results for orders placed or performed in visit on 12/31/20  POCT urine pregnancy  Result Value Ref Range   Preg Test, Ur Positive (A) Negative    Assessment:  1. Encounter for well woman exam with routine gynecological exam   2. Cervical cancer screening   3. Amenorrhea   4. Pregnancy confirmed by positive urine test   5. Morbid obesity with BMI of 40.0-44.9, adult ([place])      Plan:  Amenorrhea, UPT positive today. Counseled on PNV, dietary restrictions and safe medication use. Blood tests: labs deferred to new OB intake. Breast self exam technique reviewed and patient encouraged to perform self-exam monthly. Contraception: none. Discussed healthy lifestyle modifications. Mammogram  Not age appropriate Pap smear performed today. COVID vaccination status: UTD Flu vaccine: Will defer until after first trimester  Follow up in 1 year for annual exam   Rubie Maid, MD [facility]

## 2020-12-30 NOTE — Patient Instructions (Signed)

## 2020-12-31 ENCOUNTER — Ambulatory Visit (INDEPENDENT_AMBULATORY_CARE_PROVIDER_SITE_OTHER): Payer: 59 | Admitting: Obstetrics and Gynecology

## 2020-12-31 ENCOUNTER — Other Ambulatory Visit (HOSPITAL_COMMUNITY)
Admission: RE | Admit: 2020-12-31 | Discharge: 2020-12-31 | Disposition: A | Discharge status: Home / Self Care | Payer: 59 | Source: Ambulatory Visit | Attending: Obstetrics and Gynecology | Admitting: Obstetrics and Gynecology

## 2020-12-31 ENCOUNTER — Other Ambulatory Visit: Payer: Self-pay

## 2020-12-31 ENCOUNTER — Encounter: Payer: Self-pay | Admitting: Obstetrics and Gynecology

## 2020-12-31 VITALS — BP 128/81 | HR 83 | Ht 65.0 in | Wt 246.8 lb

## 2020-12-31 DIAGNOSIS — Z6841 Body Mass Index (BMI) 40.0 and over, adult: Secondary | ICD-10-CM | POA: Diagnosis not present

## 2020-12-31 DIAGNOSIS — Z3201 Encounter for pregnancy test, result positive: Secondary | ICD-10-CM | POA: Diagnosis not present

## 2020-12-31 DIAGNOSIS — Z01419 Encounter for gynecological examination (general) (routine) without abnormal findings: Secondary | ICD-10-CM | POA: Diagnosis not present

## 2020-12-31 DIAGNOSIS — Z124 Encounter for screening for malignant neoplasm of cervix: Secondary | ICD-10-CM | POA: Diagnosis not present

## 2020-12-31 DIAGNOSIS — N912 Amenorrhea, unspecified: Secondary | ICD-10-CM | POA: Diagnosis not present

## 2020-12-31 LAB — POCT URINE PREGNANCY: Preg Test, Ur: POSITIVE — AB

## 2021-01-01 ENCOUNTER — Encounter: Payer: 59 | Admitting: Obstetrics and Gynecology

## 2021-01-07 LAB — CYTOLOGY - PAP
Comment: NEGATIVE
Diagnosis: NEGATIVE
High risk HPV: NEGATIVE

## 2021-01-23 ENCOUNTER — Other Ambulatory Visit: Payer: 59

## 2021-01-29 NOTE — Patient Instructions (Signed)
First Trimester of Pregnancy °The first trimester of pregnancy starts on the first day of your last menstrual period until the end of week 12. This is also called months 1 through 3 of pregnancy. °Body changes during your first trimester °Your body goes through many changes during pregnancy. The changes usually return to normal after your baby is born. °Physical changes °You may gain or lose weight. °Your breasts may grow larger and hurt. The area around your nipples may get darker. °Dark spots or blotches may develop on your face. °You may have changes in your hair. °Health changes °You may feel like you might vomit (nauseous), and you may vomit. °You may have heartburn. °You may have headaches. °You may have trouble pooping (constipation). °Your gums may bleed. °Other changes °You may get tired easily. °You may pee (urinate) more often. °Your menstrual periods will stop. °You may not feel hungry. °You may want to eat certain kinds of food. °You may have changes in your emotions from day to day. °You may have more dreams. °Follow these instructions at home: °Medicines °Take over-the-counter and prescription medicines only as told by your doctor. Some medicines are not safe during pregnancy. °Take a prenatal vitamin that contains at least 600 micrograms (mcg) of folic acid. °Eating and drinking °Eat healthy meals that include: °Fresh fruits and vegetables. °Whole grains. °Good sources of protein, such as meat, eggs, or tofu. °Low-fat dairy products. °Avoid raw meat and unpasteurized juice, milk, and cheese. °If you feel like you may vomit, or you vomit: °Eat 4 or 5 small meals a day instead of 3 large meals. °Try eating a few soda crackers. °Drink liquids between meals instead of during meals. °You may need to take these actions to prevent or treat trouble pooping: °Drink enough fluids to keep your pee (urine) pale yellow. °Eat foods that are high in fiber. These include beans, whole grains, and fresh fruits and  vegetables. °Limit foods that are high in fat and sugar. These include fried or sweet foods. °Activity °Exercise only as told by your doctor. Most people can do their usual exercise routine during pregnancy. °Stop exercising if you have cramps or pain in your lower belly (abdomen) or low back. °Do not exercise if it is too hot or too humid, or if you are in a place of great height (high altitude). °Avoid heavy lifting. °If you choose to, you may have sex unless your doctor tells you not to. °Relieving pain and discomfort °Wear a good support bra if your breasts are sore. °Rest with your legs raised (elevated) if you have leg cramps or low back pain. °If you have bulging veins (varicose veins) in your legs: °Wear support hose as told by your doctor. °Raise your feet for 15 minutes, 3-4 times a day. °Limit salt in your food. °Safety °Wear your seat belt at all times when you are in a car. °Talk with your doctor if someone is hurting you or yelling at you. °Talk with your doctor if you are feeling sad or have thoughts of hurting yourself. °Lifestyle °Do not use hot tubs, steam rooms, or saunas. °Do not douche. Do not use tampons or scented sanitary pads. °Do not use herbal medicines, illegal drugs, or medicines that are not approved by your doctor. Do not drink alcohol. °Do not smoke or use any products that contain nicotine or tobacco. If you need help quitting, ask your doctor. °Avoid cat litter boxes and soil that is used by cats. These carry germs   that can cause harm to the baby and can cause a loss of your baby by miscarriage or stillbirth. °General instructions °Keep all follow-up visits. This is important. °Ask for help if you need counseling or if you need help with nutrition. Your doctor can give you advice or tell you where to go for help. °Visit your dentist. At home, brush your teeth with a soft toothbrush. Floss gently. °Write down your questions. Take them to your prenatal visits. °Where to find more  information °American Pregnancy Association: americanpregnancy.org °American College of Obstetricians and Gynecologists: www.acog.org °Office on Women's Health: womenshealth.gov/pregnancy °Contact a doctor if: °You are dizzy. °You have a fever. °You have mild cramps or pressure in your lower belly. °You have a nagging pain in your belly area. °You continue to feel like you may vomit, you vomit, or you have watery poop (diarrhea) for 24 hours or longer. °You have a bad-smelling fluid coming from your vagina. °You have pain when you pee. °You are exposed to a disease that spreads from person to person, such as chickenpox, measles, Zika virus, HIV, or hepatitis. °Get help right away if: °You have spotting or bleeding from your vagina. °You have very bad belly cramping or pain. °You have shortness of breath or chest pain. °You have any kind of injury, such as from a fall or a car crash. °You have new or increased pain, swelling, or redness in an arm or leg. °Summary °The first trimester of pregnancy starts on the first day of your last menstrual period until the end of week 12 (months 1 through 3). °Eat 4 or 5 small meals a day instead of 3 large meals. °Do not smoke or use any products that contain nicotine or tobacco. If you need help quitting, ask your doctor. °Keep all follow-up visits. °This information is not intended to replace advice given to you by your health care provider. Make sure you discuss any questions you have with your health care provider. °Document Revised: 07/05/2019 Document Reviewed: 05/11/2019 °Elsevier Patient Education © 2022 Elsevier Inc[employer] °Commonly Asked Questions During Pregnancy ° °Cats: A parasite can be excreted in cat feces.  To avoid exposure you need to have another person empty the little box.  If you must empty the litter box you will need to wear gloves.  Wash your hands after handling your cat.  This parasite can also be found in raw or undercooked meat so this should also be  avoided. ° °Colds, Sore Throats, Flu: Please check your medication sheet to see what you can take for symptoms.  If your symptoms are unrelieved by these medications please call the office. ° °Dental Work: Most any dental work your dentist recommends is permitted.  X-rays should only be taken during the first trimester if absolutely necessary.  Your abdomen should be shielded with a lead apron during all x-rays.  Please notify your provider prior to receiving any x-rays.  Novocaine is fine; gas is not recommended.  If your dentist requires a note from us prior to dental work please call the office and we will provide one for you. ° °Exercise: Exercise is an important part of staying healthy during your pregnancy.  You may continue most exercises you were accustomed to prior to pregnancy.  Later in your pregnancy you will most likely notice you have difficulty with activities requiring balance like riding a bicycle.  It is important that you listen to your body and avoid activities that put you at a higher risk   of falling.  Adequate rest and staying well hydrated are a must!  If you have questions about the safety of specific activities ask your provider.   ° °Exposure to Children with illness: Try to avoid obvious exposure; report any symptoms to us when noted,  If you have chicken pos, red measles or mumps, you should be immune to these diseases.   Please do not take any vaccines while pregnant unless you have checked with your OB provider. ° °Fetal Movement: After 28 weeks we recommend you do "kick counts" twice daily.  Lie or sit down in a calm quiet environment and count your baby movements "kicks".  You should feel your baby at least 10 times per hour.  If you have not felt 10 kicks within the first hour get up, walk around and have something sweet to eat or drink then repeat for an additional hour.  If count remains less than 10 per hour notify your provider. ° °Fumigating: Follow your pest control agent's  advice as to how long to stay out of your home.  Ventilate the area well before re-entering. ° °Hemorrhoids:   Most over-the-counter preparations can be used during pregnancy.  Check your medication to see what is safe to use.  It is important to use a stool softener or fiber in your diet and to drink lots of liquids.  If hemorrhoids seem to be getting worse please call the office.  ° °Hot Tubs:  Hot tubs Jacuzzis and saunas are not recommended while pregnant.  These increase your internal body temperature and should be avoided. ° °Intercourse:  Sexual intercourse is safe during pregnancy as long as you are comfortable, unless otherwise advised by your provider.  Spotting may occur after intercourse; report any bright red bleeding that is heavier than spotting. ° °Labor:  If you know that you are in labor, please go to the hospital.  If you are unsure, please call the office and let us help you decide what to do. ° °Lifting, straining, etc:  If your job requires heavy lifting or straining please check with your provider for any limitations.  Generally, you should not lift items heavier than that you can lift simply with your hands and arms (no back muscles) ° °Painting:  Paint fumes do not harm your pregnancy, but may make you ill and should be avoided if possible.  Latex or water based paints have less odor than oils.  Use adequate ventilation while painting. ° °Permanents & Hair Color:  Chemicals in hair dyes are not recommended as they cause increase hair dryness which can increase hair loss during pregnancy.  " Highlighting" and permanents are allowed.  Dye may be absorbed differently and permanents may not hold as well during pregnancy. ° °Sunbathing:  Use a sunscreen, as skin burns easily during pregnancy.  Drink plenty of fluids; avoid over heating. ° °Tanning Beds:  Because their possible side effects are still unknown, tanning beds are not recommended. ° °Ultrasound Scans:  Routine ultrasounds are performed  at approximately 20 weeks.  You will be able to see your baby's general anatomy an if you would like to know the gender this can usually be determined as well.  If it is questionable when you conceived you may also receive an ultrasound early in your pregnancy for dating purposes.  Otherwise ultrasound exams are not routinely performed unless there is a medical necessity.  Although you can request a scan we ask that you pay for it when conducted   because insurance does not cover " patient request" scans. ° °Work: If your pregnancy proceeds without complications you may work until your due date, unless your physician or employer advises otherwise. ° °Round Ligament Pain/Pelvic Discomfort:  Sharp, shooting pains not associated with bleeding are fairly common, usually occurring in the second trimester of pregnancy.  They tend to be worse when standing up or when you remain standing for long periods of time.  These are the result of pressure of certain pelvic ligaments called "round ligaments".  Rest, Tylenol and heat seem to be the most effective relief.  As the womb and fetus grow, they rise out of the pelvis and the discomfort improves.  Please notify the office if your pain seems different than that described.  It may represent a more serious condition. ° °Morning Sickness °Morning sickness is when you feel like you may vomit (feel nauseous) during pregnancy. Sometimes, you may vomit. Morning sickness most often happens in the morning, but it can also happen at any time of the day. Some women may have morning sickness that makes them vomit all the time. This is a more serious problem that needs treatment. °What are the causes? °The cause of this condition is not known. °What increases the risk? °You had vomiting or a feeling like you may vomit before your pregnancy. °You had morning sickness in another pregnancy. °You are pregnant with more than one baby, such as twins. °What are the signs or symptoms? °Feeling like  you may vomit. °Vomiting. °How is this treated? °Treatment is usually not needed for this condition. You may only need to change what you eat. In some cases, your doctor may give you some things to take for your condition. These include: °Vitamin B6 supplements. °Medicines to treat the feeling that you may vomit. °Ginger. °Follow these instructions at home: °Medicines °Take over-the-counter and prescription medicines only as told by your doctor. Do not take any medicines until you talk with your doctor about them first. °Take multivitamins before you get pregnant. These can stop or lessen the symptoms of morning sickness. °Eating and drinking °Eat dry toast or crackers before getting out of bed. °Eat 5 or 6 small meals a day. °Eat dry and bland foods like rice and baked potatoes. °Do not eat greasy, fatty, or spicy foods. °Have someone cook for you if the smell of food causes you to vomit or to feel like you may vomit. °If you feel like you may vomit after taking prenatal vitamins, take them at night or with a snack. °Eat protein foods when you need a snack. Nuts, yogurt, and cheese are good choices. °Drink fluids throughout the day. °Try ginger ale made with real ginger, ginger tea made from fresh grated ginger, or ginger candies. °General instructions °Do not smoke or use any products that contain nicotine or tobacco. If you need help quitting, ask your doctor. °Use an air purifier to keep the air in your house free of smells. °Get lots of fresh air. °Try to avoid smells that make you feel sick. °Try wearing an acupressure wristband. This is a wristband that is used to treat seasickness. °Try a treatment called acupuncture. In this treatment, a doctor puts needles into certain areas of your body to make you feel better. °Contact a doctor if: °You need medicine to feel better. °You feel dizzy or light-headed. °You are losing weight. °Get help right away if: °The feeling that you may vomit will not go away, or you  cannot   stop vomiting. °You faint. °You have very bad pain in your belly. °Summary °Morning sickness is when you feel like you may vomit (feel nauseous) during pregnancy. °You may feel sick in the morning, but you can feel this way at any time of the day. °Making some changes to what you eat may help your symptoms go away. °This information is not intended to replace advice given to you by your health care provider. Make sure you discuss any questions you have with your health care provider. °Document Revised: 09/11/2019 Document Reviewed: 08/21/2019 °Elsevier Patient Education © 2022 Elsevier Inc. ° °

## 2021-01-30 ENCOUNTER — Other Ambulatory Visit: Payer: Self-pay

## 2021-01-30 ENCOUNTER — Ambulatory Visit (INDEPENDENT_AMBULATORY_CARE_PROVIDER_SITE_OTHER): Payer: 59 | Admitting: Obstetrics and Gynecology

## 2021-01-30 VITALS — BP 124/89 | HR 83 | Resp 16 | Ht 65.0 in | Wt 246.6 lb

## 2021-01-30 DIAGNOSIS — Z113 Encounter for screening for infections with a predominantly sexual mode of transmission: Secondary | ICD-10-CM

## 2021-01-30 DIAGNOSIS — O99210 Obesity complicating pregnancy, unspecified trimester: Secondary | ICD-10-CM

## 2021-01-30 DIAGNOSIS — Z3481 Encounter for supervision of other normal pregnancy, first trimester: Secondary | ICD-10-CM

## 2021-01-30 DIAGNOSIS — R69 Illness, unspecified: Secondary | ICD-10-CM | POA: Diagnosis not present

## 2021-01-30 DIAGNOSIS — Z3A10 10 weeks gestation of pregnancy: Secondary | ICD-10-CM | POA: Diagnosis not present

## 2021-01-30 NOTE — Progress Notes (Signed)
Megan Sullivan presents for NOB nurse interview visit. Pregnancy confirmation done 11/[id number].  [id number]. Pregnancy education material explained and given. No cats in the home. NOB labs ordered. TSH/HbgA1c due to Increased BMI. Body mass index is 41.04 kg/m. Sickle cell ordered, she has a trait of sickle cell. HIV labs and Drug screen were explained optional and she did not decline. Drug screen ordered. PNV encouraged. Genetic screening options discussed. Genetic testing: Declined. Financial policy reviewed. FMLA forms reviewed and signed. Pt. To follow up with Dr. Valentino Saxon in 1 week for NOB physical.  All questions answered.

## 2021-01-31 LAB — PAIN MGT SCRN (14 DRUGS), UR
Amphetamine Scrn, Ur: NEGATIVE ng/mL
BARBITURATE SCREEN URINE: NEGATIVE ng/mL
BENZODIAZEPINE SCREEN, URINE: NEGATIVE ng/mL
Buprenorphine, Urine: NEGATIVE ng/mL
CANNABINOIDS UR QL SCN: NEGATIVE ng/mL
Cocaine (Metab) Scrn, Ur: NEGATIVE ng/mL
Creatinine(Crt), U: 129.8 mg/dL (ref 20.0–300.0)
Fentanyl, Urine: NEGATIVE pg/mL
Meperidine Screen, Urine: NEGATIVE ng/mL
Methadone Screen, Urine: NEGATIVE ng/mL
OXYCODONE+OXYMORPHONE UR QL SCN: NEGATIVE ng/mL
Opiate Scrn, Ur: NEGATIVE ng/mL
Ph of Urine: 5.9 (ref 4.5–8.9)
Phencyclidine Qn, Ur: NEGATIVE ng/mL
Propoxyphene Scrn, Ur: NEGATIVE ng/mL
Tramadol Screen, Urine: NEGATIVE ng/mL

## 2021-01-31 LAB — URINALYSIS, ROUTINE W REFLEX MICROSCOPIC
Bilirubin, UA: NEGATIVE
Glucose, UA: NEGATIVE
Ketones, UA: NEGATIVE
Leukocytes,UA: NEGATIVE
Nitrite, UA: NEGATIVE
Protein,UA: NEGATIVE
RBC, UA: NEGATIVE
Specific Gravity, UA: 1.017 (ref 1.005–1.030)
Urobilinogen, Ur: 0.2 mg/dL (ref 0.2–1.0)
pH, UA: 6 (ref 5.0–7.5)

## 2021-02-02 LAB — CULTURE, OB URINE

## 2021-02-02 LAB — URINE CULTURE, OB REFLEX

## 2021-02-05 LAB — GC/CHLAMYDIA PROBE AMP
Chlamydia trachomatis, NAA: NEGATIVE
Neisseria Gonorrhoeae by PCR: NEGATIVE

## 2021-02-06 LAB — VIRAL HEPATITIS HBV, HCV
HCV Ab: 0.2 s/co ratio (ref 0.0–0.9)
Hep B Core Total Ab: NEGATIVE
Hep B Surface Ab, Qual: REACTIVE
Hepatitis B Surface Ag: NEGATIVE

## 2021-02-06 LAB — HGB SOLU + RFLX FRAC: Sickle Solubility Test - HGBRFX: POSITIVE — AB

## 2021-02-06 LAB — HEMOGLOBIN A1C
Est. average glucose Bld gHb Est-mCnc: 117 mg/dL
Hgb A1c MFr Bld: 5.7 % — ABNORMAL HIGH (ref 4.8–5.6)

## 2021-02-06 LAB — HCV INTERPRETATION

## 2021-02-06 LAB — ABO AND RH: Rh Factor: POSITIVE

## 2021-02-06 LAB — RPR: RPR Ser Ql: NONREACTIVE

## 2021-02-06 LAB — RUBELLA SCREEN: Rubella Antibodies, IGG: 2.36 index (ref >0.99)

## 2021-02-06 LAB — PARVOVIRUS B19 ANTIBODY, IGG AND IGM
Parvovirus B19 IgG: 5.9 index — ABNORMAL HIGH (ref 0.0–0.8)
Parvovirus B19 IgM: 0.1 index (ref 0.0–0.8)

## 2021-02-06 LAB — HGB FRACTIONATION CASCADE
Hgb A2: 3.5 % — ABNORMAL HIGH (ref 1.8–3.2)
Hgb A: 58.6 % — ABNORMAL LOW (ref 96.4–98.8)
Hgb F: 0 % (ref 0.0–2.0)
Hgb S: 37.9 % — ABNORMAL HIGH

## 2021-02-06 LAB — TSH: TSH: 2.36 u[IU]/mL (ref 0.450–4.500)

## 2021-02-06 LAB — NICOTINE/COTININE METABOLITES
Cotinine: <1 ng/mL
Nicotine: <1 ng/mL

## 2021-02-06 LAB — ANTIBODY SCREEN: Antibody Screen: NEGATIVE

## 2021-02-06 LAB — HIV ANTIBODY (ROUTINE TESTING W REFLEX): HIV Screen 4th Generation wRfx: NONREACTIVE

## 2021-02-06 LAB — VARICELLA ZOSTER ANTIBODY, IGG: Varicella zoster IgG: 1150 index (ref >165)

## 2021-02-07 ENCOUNTER — Ambulatory Visit: Payer: 59

## 2021-02-09 NOTE — L&D Delivery Note (Signed)
Delivery Summary for Megan Sullivan  Labor Events:   Preterm labor: No data found  Rupture date: 08/23/2021  Rupture time: 1:29 PM  Rupture type: Spontaneous Intact  Fluid Color: Clear Pink  Induction: No data found  Augmentation: No data found  Complications: No data found  Cervical ripening: No data found No data found   No data found     Delivery:   Episiotomy: No data found  Lacerations: No data found  Repair suture: No data found  Repair # of packets: No data found  Blood loss (ml): No data found   Information for the patient's newborn:  [name], [name] [[id number]]   Delivery 08/23/2021 4:45 PM by  Vaginal, Spontaneous Sex:  female Gestational Age: [redacted]w[redacted]d Delivery Clinician:   Living?:         APGARS  One minute Five minutes Ten minutes  Skin color:        Heart rate:        Grimace:        Muscle tone:        Breathing:        Totals: 8  9      Presentation/position:      Resuscitation:   Cord information:    Disposition of cord blood:     Blood gases sent?  Complications:   Placenta: Delivered:       appearance Newborn Measurements: Weight: 8 lb 12 oz (3970 g)  Height: 21.5"  Head circumference:    Chest circumference:    Other providers:    Additional  information: Forceps:   Vacuum:   Breech:   Observed anomalies      Delivery Note At 4:45 PM a viable and healthy female was delivered via Vaginal, Spontaneous (Presentation: Left Occiput Anterior).  APGAR: 8, 9; weight  3970 grams.  IV Pitocin bolus initiated after delivery of fetus.  Placenta status: Spontaneous, Intact.  Cord: 3 vessels with the following complications:  None.  Cord pH: not obtained.  Delayed cord clamping observed > 2 minutes.   Anesthesia: Epidural Episiotomy: None Lacerations: 1st degree perineal; hemostatic Suture Repair:  None Est. Blood Loss (mL):  50  Mom to postpartum.  Baby to Couplet care / Skin to Skin.  Hildred Laser, MD 08/23/2021, 5:02 PM

## 2021-02-12 ENCOUNTER — Encounter: Payer: Self-pay | Admitting: Obstetrics and Gynecology

## 2021-02-12 ENCOUNTER — Other Ambulatory Visit: Payer: Self-pay

## 2021-02-12 ENCOUNTER — Ambulatory Visit (INDEPENDENT_AMBULATORY_CARE_PROVIDER_SITE_OTHER): Payer: 59 | Admitting: Obstetrics and Gynecology

## 2021-02-12 VITALS — BP 129/76 | HR 84 | Ht 65.0 in | Wt 252.0 lb

## 2021-02-12 DIAGNOSIS — Z3481 Encounter for supervision of other normal pregnancy, first trimester: Secondary | ICD-10-CM | POA: Diagnosis not present

## 2021-02-12 DIAGNOSIS — Z01419 Encounter for gynecological examination (general) (routine) without abnormal findings: Secondary | ICD-10-CM | POA: Diagnosis not present

## 2021-02-12 DIAGNOSIS — R7309 Other abnormal glucose: Secondary | ICD-10-CM | POA: Diagnosis not present

## 2021-02-12 DIAGNOSIS — Z23 Encounter for immunization: Secondary | ICD-10-CM | POA: Diagnosis not present

## 2021-02-12 DIAGNOSIS — Z8669 Personal history of other diseases of the nervous system and sense organs: Secondary | ICD-10-CM

## 2021-02-12 DIAGNOSIS — D573 Sickle-cell trait: Secondary | ICD-10-CM | POA: Diagnosis not present

## 2021-02-12 DIAGNOSIS — O99210 Obesity complicating pregnancy, unspecified trimester: Secondary | ICD-10-CM

## 2021-02-12 DIAGNOSIS — R7989 Other specified abnormal findings of blood chemistry: Secondary | ICD-10-CM

## 2021-02-12 LAB — POCT URINALYSIS DIPSTICK OB
Bilirubin, UA: NEGATIVE
Blood, UA: NEGATIVE
Glucose, UA: NEGATIVE
Ketones, UA: NEGATIVE
Leukocytes, UA: NEGATIVE
Nitrite, UA: NEGATIVE
POC,PROTEIN,UA: NEGATIVE
Spec Grav, UA: 1.025 (ref 1.010–1.025)
Urobilinogen, UA: 0.2 E.U./dL
pH, UA: 6.5 (ref 5.0–8.0)

## 2021-02-12 NOTE — Progress Notes (Signed)
OBSTETRIC INITIAL PRENATAL VISIT  Subjective:    Megan Sullivan is being seen today for her first obstetrical visit.  This is a planned pregnancy. She is a 36 y.o. G25P1011 female at [redacted]w[redacted]d gestation, Estimated Date of Delivery: 08/26/21 by Patient's last menstrual period was 11/19/2020 (exact date).   Her obstetrical history is significant for obesity, sickle cell trait, and prediabetes (newly diagnosed 2 months ago). Relationship with FOB: spouse, living together. Patient does intend to breast feed. Pregnancy history fully reviewed. Denies complaints today.     OB History  Gravida Para Term Preterm AB Living  3 1 1  0 1 1  SAB IAB Ectopic Multiple Live Births  0 0 0 0 1    # Outcome Date GA Lbr Len/2nd Weight Sex Delivery Anes PTL Lv  3 Current           2 Term 07/13/18 [redacted]w[redacted]d / 00:18 6 lb 13.4 oz (3.1 kg) F Vag-Spont EPI  LIV     Name: SHOLANDA, CROSON     Apgar1: 8  Apgar5: 9  1 AB             Gynecologic History:  Last pap smear was 12/31/2020.  Results were Normal. Denies h/o abnormal pap smears in the past.  Denies history of STIs.  Contraception prior to conception: Stopped OCPs in May.    Past Medical History:  Diagnosis Date   Asthma    Migraines     Family History  Problem Relation Age of Onset   Hyperlipidemia Father    Diabetes Maternal Grandmother    Healthy Mother    Breast cancer Neg Hx    Ovarian cancer Neg Hx    Colon cancer Neg Hx     Past Surgical History:  Procedure Laterality Date   DILATION AND CURETTAGE OF UTERUS  2018   OVARIAN CYST REMOVAL  2017    Social History   Socioeconomic History   Marital status: Married    Spouse name: Not on file   Number of children: Not on file   Years of education: Not on file   Highest education level: Not on file  Occupational History   Not on file  Tobacco Use   Smoking status: Never   Smokeless tobacco: Never  Vaping Use   Vaping Use: Never used  Substance and Sexual Activity   Alcohol  use: Never   Drug use: Never   Sexual activity: Yes    Birth control/protection: None  Other Topics Concern   Not on file  Social History Narrative   Not on file   Social Determinants of Health   Financial Resource Strain: Not on file  Food Insecurity: Not on file  Transportation Needs: Not on file  Physical Activity: Not on file  Stress: Not on file  Social Connections: Not on file  Intimate Partner Violence: Not on file    Current Outpatient Medications on File Prior to Visit  Medication Sig Dispense Refill   Prenatal MV & Min w/FA-DHA (ONE A DAY PRENATAL PO)      No current facility-administered medications on file prior to visit.    No Known Allergies   Review of Systems General: Not Present- Fever, Weight Loss and Weight Gain. Skin: Not Present- Rash. HEENT: Not Present- Blurred Vision, Headache and Bleeding Gums. Respiratory: Not Present- Difficulty Breathing. Breast: Not Present- Breast Mass. Cardiovascular: Not Present- Chest Pain, Elevated Blood Pressure, Fainting / Blacking Out and Shortness of Breath. Gastrointestinal: Not Present- Abdominal Pain,  Constipation, Nausea and Vomiting. Female Genitourinary: Not Present- Frequency, Painful Urination, Pelvic Pain, Vaginal Bleeding, Vaginal Discharge, Contractions, regular, Fetal Movements Decreased, Urinary Complaints and Vaginal Fluid. Musculoskeletal: Not Present- Back Pain and Leg Cramps. Neurological: Not Present- Dizziness. + for migraine x 1 ~ 1 week ago.  Psychiatric: Not Present- Depression.     Objective:    Blood pressure 129/76, pulse 84, weight 252 lb (114.3 kg), last menstrual period 11/19/2020. Body mass index is 41.93 kg/m.  General Appearance:    Alert, cooperative, no distress, appears stated age, morbid obesity  Abdomen:   Abdomen, non-tender. FHT unable to be obtained.     Remainder of exam deferred, patient had recent physical performed by me 2 months ago.    Assessment:   1. Encounter  for supervision of other normal pregnancy in first trimester   2. History of migraine   3. Obesity in pregnancy   4. Sickle cell trait (HCC)   5. Elevated hemoglobin A1c   6. Need for immunization against influenza   7. Elevated serum creatinine     Plan:   Supervision of other normal pregnancy  - Initial labs reviewed. - Prenatal vitamins encouraged. - Problem list reviewed and updated. - New OB counseling:  The patient has been given an overview regarding routine prenatal care.   - Prenatal testing, optional genetic testing, and ultrasound use in pregnancy were reviewed.  Traditional genetic screening vs cell-fee DNA genetic screening discussed, including risks and benefits. Testing declined. - Benefits of Breast Feeding were discussed. The patient is encouraged to consider nursing her baby post partum.  2. History of migraine -Patient with history of migraines in the past.  Notes a recent migraine a week ago.  Did not have issues with her migraines during her last pregnancy.  Is hopeful that this will be the same for this pregnancy.  I discussed home relief measures, if these do not help in the future then can consider prescription medication.  3. Obesity in pregnancy - Recommendations regarding diet, weight gain, and exercise in pregnancy were given.  Advised on no more that 15-20 lbs for entire pregnancy.   4. Sickle cell trait (HCC) - Patient with h/o sickle cell trait, FOB negative. No further testing needed.   5. Elevated hemoglobin A1c - Discussed that patient will need early glucola next visit for elevated A1c. To perform at next visit.   6. Need for immunization against influenza - Flu vaccine given today.   7. Elevated serum creatinine - Noted on recent CMP from annual labs. Will repeat today.    Follow up in 4 weeks.    Hildred Laser, MD [facility]

## 2021-02-13 ENCOUNTER — Ambulatory Visit (INDEPENDENT_AMBULATORY_CARE_PROVIDER_SITE_OTHER): Payer: 59

## 2021-02-13 ENCOUNTER — Other Ambulatory Visit: Payer: Self-pay

## 2021-02-13 DIAGNOSIS — Z3201 Encounter for pregnancy test, result positive: Secondary | ICD-10-CM | POA: Diagnosis not present

## 2021-02-13 LAB — CBC
Hematocrit: 38.8 % (ref 34.0–46.6)
Hemoglobin: 12.9 g/dL (ref 11.1–15.9)
MCH: 26.8 pg (ref 26.6–33.0)
MCHC: 33.2 g/dL (ref 31.5–35.7)
MCV: 81 fL (ref 79–97)
Platelets: 205 10*3/uL (ref 150–450)
RBC: 4.81 x10E6/uL (ref 3.77–5.28)
RDW: 16.5 % — ABNORMAL HIGH (ref 11.7–15.4)
WBC: 10.1 10*3/uL (ref 3.4–10.8)

## 2021-02-13 LAB — COMPREHENSIVE METABOLIC PANEL
ALT: 11 IU/L (ref 0–32)
AST: 15 IU/L (ref 0–40)
Albumin/Globulin Ratio: 1.8 (ref 1.2–2.2)
Albumin: 4.3 g/dL (ref 3.8–4.8)
Alkaline Phosphatase: 43 IU/L — ABNORMAL LOW (ref 44–121)
BUN/Creatinine Ratio: 11 (ref 9–23)
BUN: 8 mg/dL (ref 6–20)
Bilirubin Total: 0.5 mg/dL (ref 0.0–1.2)
CO2: 22 mmol/L (ref 20–29)
Calcium: 9 mg/dL (ref 8.7–10.2)
Chloride: 103 mmol/L (ref 96–106)
Creatinine, Ser: 0.74 mg/dL (ref 0.57–1.00)
Globulin, Total: 2.4 g/dL (ref 1.5–4.5)
Glucose: 89 mg/dL (ref 70–99)
Potassium: 3.9 mmol/L (ref 3.5–5.2)
Sodium: 138 mmol/L (ref 134–144)
Total Protein: 6.7 g/dL (ref 6.0–8.5)
eGFR: 109 mL/min/{1.73_m2} (ref >59)

## 2021-03-12 ENCOUNTER — Other Ambulatory Visit: Payer: Self-pay

## 2021-03-12 ENCOUNTER — Ambulatory Visit (INDEPENDENT_AMBULATORY_CARE_PROVIDER_SITE_OTHER): Payer: 59 | Admitting: Obstetrics and Gynecology

## 2021-03-12 ENCOUNTER — Encounter: Payer: Self-pay | Admitting: Obstetrics and Gynecology

## 2021-03-12 ENCOUNTER — Other Ambulatory Visit: Payer: 59

## 2021-03-12 VITALS — BP 131/80 | HR 95 | Wt 260.1 lb

## 2021-03-12 DIAGNOSIS — R7309 Other abnormal glucose: Secondary | ICD-10-CM | POA: Diagnosis not present

## 2021-03-12 DIAGNOSIS — Z3482 Encounter for supervision of other normal pregnancy, second trimester: Secondary | ICD-10-CM

## 2021-03-12 DIAGNOSIS — O99210 Obesity complicating pregnancy, unspecified trimester: Secondary | ICD-10-CM

## 2021-03-12 DIAGNOSIS — Z3A16 16 weeks gestation of pregnancy: Secondary | ICD-10-CM

## 2021-03-12 LAB — POCT URINALYSIS DIPSTICK OB
Bilirubin, UA: NEGATIVE
Blood, UA: NEGATIVE
Glucose, UA: NEGATIVE
Ketones, UA: NEGATIVE
Leukocytes, UA: NEGATIVE
Nitrite, UA: NEGATIVE
POC,PROTEIN,UA: NEGATIVE
Spec Grav, UA: 1.02 (ref 1.010–1.025)
Urobilinogen, UA: 0.2 E.U./dL
pH, UA: 6 (ref 5.0–8.0)

## 2021-03-12 NOTE — Progress Notes (Signed)
ROB: Feels well.  Did early 1 hour GCT today because of elevated hemoglobin A1c.  aFP today.  20-week ultrasound for anatomy scheduled.

## 2021-03-12 NOTE — Progress Notes (Signed)
ROB. Patient states she has been experiencing migraines more frequently, has been taking tylenol as needed with minor relief. Patient states having round ligament pain as well, discussed belly band options. Patient states no other questions or concerns.

## 2021-03-13 LAB — GLUCOSE, 1 HOUR GESTATIONAL: Gestational Diabetes Screen: 153 mg/dL — ABNORMAL HIGH (ref 70–139)

## 2021-03-18 ENCOUNTER — Other Ambulatory Visit: Payer: Self-pay

## 2021-03-18 ENCOUNTER — Other Ambulatory Visit: Payer: 59

## 2021-03-18 DIAGNOSIS — O99210 Obesity complicating pregnancy, unspecified trimester: Secondary | ICD-10-CM

## 2021-03-18 DIAGNOSIS — Z3482 Encounter for supervision of other normal pregnancy, second trimester: Secondary | ICD-10-CM

## 2021-03-18 DIAGNOSIS — Z3A17 17 weeks gestation of pregnancy: Secondary | ICD-10-CM | POA: Diagnosis not present

## 2021-03-18 LAB — AFP, SERUM, OPEN SPINA BIFIDA
AFP MoM: 0.77
AFP Value: 19.9 ng/mL
Gest. Age on Collection Date: 16.1 weeks
Maternal Age At EDD: 33.9 yr
OSBR Risk 1 IN: 10000
Test Results:: NEGATIVE
Weight: 260 [lb_av]

## 2021-03-19 LAB — GESTATIONAL GLUCOSE TOLERANCE
Glucose, Fasting: 85 mg/dL (ref 70–94)
Glucose, GTT - 1 Hour: 168 mg/dL (ref 70–179)
Glucose, GTT - 2 Hour: 136 mg/dL (ref 70–154)
Glucose, GTT - 3 Hour: 93 mg/dL (ref 70–139)

## 2021-04-11 ENCOUNTER — Ambulatory Visit (INDEPENDENT_AMBULATORY_CARE_PROVIDER_SITE_OTHER): Payer: 59

## 2021-04-11 ENCOUNTER — Encounter: Payer: Self-pay | Admitting: Obstetrics and Gynecology

## 2021-04-11 ENCOUNTER — Other Ambulatory Visit: Payer: Self-pay

## 2021-04-11 ENCOUNTER — Ambulatory Visit (INDEPENDENT_AMBULATORY_CARE_PROVIDER_SITE_OTHER): Payer: 59 | Admitting: Obstetrics and Gynecology

## 2021-04-11 VITALS — BP 127/73 | HR 85 | Wt 262.1 lb

## 2021-04-11 DIAGNOSIS — Z3A20 20 weeks gestation of pregnancy: Secondary | ICD-10-CM

## 2021-04-11 DIAGNOSIS — Z3482 Encounter for supervision of other normal pregnancy, second trimester: Secondary | ICD-10-CM

## 2021-04-11 DIAGNOSIS — Z362 Encounter for other antenatal screening follow-up: Secondary | ICD-10-CM

## 2021-04-11 DIAGNOSIS — Z3A16 16 weeks gestation of pregnancy: Secondary | ICD-10-CM

## 2021-04-11 DIAGNOSIS — Z3492 Encounter for supervision of normal pregnancy, unspecified, second trimester: Secondary | ICD-10-CM | POA: Diagnosis not present

## 2021-04-11 DIAGNOSIS — Z8669 Personal history of other diseases of the nervous system and sense organs: Secondary | ICD-10-CM

## 2021-04-11 LAB — POCT URINALYSIS DIPSTICK OB
Bilirubin, UA: NEGATIVE
Blood, UA: NEGATIVE
Glucose, UA: NEGATIVE
Ketones, UA: NEGATIVE
Leukocytes, UA: NEGATIVE
Nitrite, UA: NEGATIVE
Spec Grav, UA: 1.01 (ref 1.010–1.025)
Urobilinogen, UA: 0.2 E.U./dL
pH, UA: 7 (ref 5.0–8.0)

## 2021-04-11 MED ORDER — BUTALBITAL-APAP-CAFFEINE 50-325-40 MG PO CAPS: 1.0000 | ORAL_CAPSULE | Freq: Four times a day (QID) | ORAL | 3 refills | Status: DC | PRN

## 2021-04-11 NOTE — Progress Notes (Signed)
ROB: She has migraines and she can't take her usual medication. She needs a safe medication to take for her migraines. She had 1 migraine that lasted for two days. ?

## 2021-04-11 NOTE — Progress Notes (Signed)
ROB: Experiencing some round ligament and pubic bone pain.  Plans to get a belly band.  Noting more migraines lately.  Discussed home measures, also can prescribe Fioricet. Incomplete anatomy scan today, for repeat  in 2-4 weeks.  RTC in 4 weeks for OB visit.  ?

## 2021-04-14 ENCOUNTER — Telehealth: Payer: Self-pay | Admitting: Obstetrics and Gynecology

## 2021-04-14 NOTE — Telephone Encounter (Signed)
Pharmacy called stating that Fioricet RX never made it over to them- I confirmed that it was esnt on Friday she is saying that they can not see it and requesting to it be resent. Please advise.  ?

## 2021-04-14 NOTE — Telephone Encounter (Signed)
The rx was sent via fax again on 04/14/2021 at 9:14 confirmation successful. ?

## 2021-05-02 ENCOUNTER — Other Ambulatory Visit: Payer: Self-pay

## 2021-05-02 ENCOUNTER — Ambulatory Visit (INDEPENDENT_AMBULATORY_CARE_PROVIDER_SITE_OTHER): Payer: 59

## 2021-05-02 DIAGNOSIS — Z362 Encounter for other antenatal screening follow-up: Secondary | ICD-10-CM | POA: Diagnosis not present

## 2021-05-08 ENCOUNTER — Other Ambulatory Visit: Payer: Self-pay

## 2021-05-08 ENCOUNTER — Encounter: Payer: Self-pay | Admitting: Obstetrics and Gynecology

## 2021-05-08 ENCOUNTER — Ambulatory Visit (INDEPENDENT_AMBULATORY_CARE_PROVIDER_SITE_OTHER): Payer: 59 | Admitting: Obstetrics and Gynecology

## 2021-05-08 VITALS — BP 124/84 | HR 93 | Wt 268.2 lb

## 2021-05-08 DIAGNOSIS — O35EXX0 Maternal care for other (suspected) fetal abnormality and damage, fetal genitourinary anomalies, not applicable or unspecified: Secondary | ICD-10-CM

## 2021-05-08 DIAGNOSIS — Z3A24 24 weeks gestation of pregnancy: Secondary | ICD-10-CM

## 2021-05-08 DIAGNOSIS — O35EXX1 Maternal care for other (suspected) fetal abnormality and damage, fetal genitourinary anomalies, fetus 1: Secondary | ICD-10-CM

## 2021-05-08 DIAGNOSIS — Z3482 Encounter for supervision of other normal pregnancy, second trimester: Secondary | ICD-10-CM

## 2021-05-08 DIAGNOSIS — Z131 Encounter for screening for diabetes mellitus: Secondary | ICD-10-CM

## 2021-05-08 LAB — POCT URINALYSIS DIPSTICK OB
Bilirubin, UA: NEGATIVE
Blood, UA: NEGATIVE
Glucose, UA: NEGATIVE
Ketones, UA: NEGATIVE
Leukocytes, UA: NEGATIVE
Nitrite, UA: NEGATIVE
POC,PROTEIN,UA: NEGATIVE
Spec Grav, UA: 1.02 (ref 1.010–1.025)
Urobilinogen, UA: 0.2 E.U./dL
pH, UA: 6 (ref 5.0–8.0)

## 2021-05-08 NOTE — Progress Notes (Signed)
ROB. Patient states fetal movement with pressure.  She states her carpal tunnel symptoms are back, trying to use brace when she can for relief. Patient states wanting to discuss recent ultrasound findings regarding kidneys. Patient states no questions or concerns at this time.   ?

## 2021-05-08 NOTE — Progress Notes (Signed)
ROB: Patient having worsening carpal tunnel especially at night.  She had this with her last pregnancy.  She plans to wear wrist support braces.  Reports daily fetal movement.  Patient has a history of elevated 1 hour GCT and normal 3-hour GTT during this pregnancy.  She has requested to again do the 1 hour GCT at her next visit.  She is aware that it is very likely she will need the 3-hour GTT.  Ultrasounds discussed in detail showing fetal hydronephrosis.  Referral to maternal-fetal medicine for further evaluation and recommendations.  We have discussed this. ?

## 2021-05-19 ENCOUNTER — Ambulatory Visit: Payer: 59 | Admitting: *Deleted

## 2021-05-19 ENCOUNTER — Other Ambulatory Visit: Payer: Self-pay | Admitting: *Deleted

## 2021-05-19 ENCOUNTER — Ambulatory Visit: Payer: 59 | Attending: Obstetrics and Gynecology

## 2021-05-19 ENCOUNTER — Encounter: Payer: Self-pay | Admitting: *Deleted

## 2021-05-19 VITALS — BP 146/81 | HR 115

## 2021-05-19 DIAGNOSIS — Z3A25 25 weeks gestation of pregnancy: Secondary | ICD-10-CM

## 2021-05-19 DIAGNOSIS — Z6841 Body Mass Index (BMI) 40.0 and over, adult: Secondary | ICD-10-CM | POA: Insufficient documentation

## 2021-05-19 DIAGNOSIS — O35EXX0 Maternal care for other (suspected) fetal abnormality and damage, fetal genitourinary anomalies, not applicable or unspecified: Secondary | ICD-10-CM

## 2021-05-19 DIAGNOSIS — O402XX0 Polyhydramnios, second trimester, not applicable or unspecified: Secondary | ICD-10-CM

## 2021-05-19 DIAGNOSIS — O99212 Obesity complicating pregnancy, second trimester: Secondary | ICD-10-CM

## 2021-05-30 ENCOUNTER — Ambulatory Visit (INDEPENDENT_AMBULATORY_CARE_PROVIDER_SITE_OTHER): Payer: 59 | Admitting: Obstetrics and Gynecology

## 2021-05-30 ENCOUNTER — Encounter: Payer: Self-pay | Admitting: Obstetrics and Gynecology

## 2021-05-30 ENCOUNTER — Other Ambulatory Visit: Payer: 59

## 2021-05-30 VITALS — BP 126/77 | HR 99 | Wt 265.6 lb

## 2021-05-30 DIAGNOSIS — Z3A27 27 weeks gestation of pregnancy: Secondary | ICD-10-CM

## 2021-05-30 DIAGNOSIS — O99810 Abnormal glucose complicating pregnancy: Secondary | ICD-10-CM

## 2021-05-30 DIAGNOSIS — Z23 Encounter for immunization: Secondary | ICD-10-CM | POA: Diagnosis not present

## 2021-05-30 DIAGNOSIS — O35EXX1 Maternal care for other (suspected) fetal abnormality and damage, fetal genitourinary anomalies, fetus 1: Secondary | ICD-10-CM

## 2021-05-30 DIAGNOSIS — Z3482 Encounter for supervision of other normal pregnancy, second trimester: Secondary | ICD-10-CM

## 2021-05-30 NOTE — Progress Notes (Signed)
ROB: doing well overall.  Is planning to repeat the 1 hour testing again prior to performing 3 hour test (previously had elevated 1 hr with normal 3 hr GTT earlier in pregnancy).  Is s/p MFM consult for bilateral hydronephrosis, normal growth, AFI subjectively increased, plan to repeat scan in 4 weeks.  BMI 44, will need Anesthesia consult if >45. Discussed practice changes due to merger, encouraged meet and greet with midwives. Will schedule for next 2 visits.  ?

## 2021-05-30 NOTE — Progress Notes (Signed)
ROB: She is doing well, no new concerns. Unable to leave urine at intake. BTC/TDAP/Glucose done today. ?

## 2021-05-30 NOTE — Patient Instructions (Signed)
Third Trimester of Pregnancy ? ?The third trimester of pregnancy is from week 28 through week 40. This is also called months 7 through 9. This trimester is when your unborn baby (fetus) is growing very fast. At the end of the ninth month, the unborn baby is about 20 inches long. It weighs about 6-10 pounds. ?Body changes during your third trimester ?Your body continues to go through many changes during this time. The changes vary and generally return to normal after the baby is born. ?Physical changes ?Your weight will continue to increase. You may gain 25-35 pounds (11-16 kg) by the end of the pregnancy. If you are underweight, you may gain 28-40 lb (about 13-18 kg). If you are overweight, you may gain 15-25 lb (about 7-11 kg). ?You may start to get stretch marks on your hips, belly (abdomen), and breasts. ?Your breasts will continue to grow and may hurt. A yellow fluid (colostrum) may leak from your breasts. This is the first milk you are making for your baby. ?You may have changes in your hair. ?Your belly button may stick out. ?You may have more swelling in your hands, face, or ankles. ?Health changes ?You may have heartburn. ?You may have trouble pooping (constipation). ?You may get hemorrhoids. These are swollen veins in the butt that can itch or get painful. ?You may have swollen veins (varicose veins) in your legs. ?You may have more body aches in the pelvis, back, or thighs. ?You may have more tingling or numbness in your hands, arms, and legs. The skin on your belly may also feel numb. ?You may feel short of breath as your womb (uterus) gets bigger. ?Other changes ?You may pee (urinate) more often. ?You may have more problems sleeping. ?You may notice the unborn baby "dropping," or moving lower in your belly. ?You may have more discharge coming from your vagina. ?Your joints may feel loose, and you may have pain around your pelvic bone. ?Follow these instructions at home: ?Medicines ?Take over-the-counter  and prescription medicines only as told by your doctor. Some medicines are not safe during pregnancy. ?Take a prenatal vitamin that contains at least 600 micrograms (mcg) of folic acid. ?Eating and drinking ?Eat healthy meals that include: ?Fresh fruits and vegetables. ?Whole grains. ?Good sources of protein, such as meat, eggs, or tofu. ?Low-fat dairy products. ?Avoid raw meat and unpasteurized juice, milk, and cheese. These carry germs that can harm you and your baby. ?Eat 4 or 5 small meals rather than 3 large meals a day. ?You may need to take these actions to prevent or treat trouble pooping: ?Drink enough fluids to keep your pee (urine) pale yellow. ?Eat foods that are high in fiber. These include beans, whole grains, and fresh fruits and vegetables. ?Limit foods that are high in fat and sugar. These include fried or sweet foods. ?Activity ?Exercise only as told by your doctor. Stop exercising if you start to have cramps in your womb. ?Avoid heavy lifting. ?Do not exercise if it is too hot or too humid, or if you are in a place of great height (high altitude). ?If you choose to, you may have sex unless your doctor tells you not to. ?Relieving pain and discomfort ?Take breaks often, and rest with your legs raised (elevated) if you have leg cramps or low back pain. ?Take warm water baths (sitz baths) to soothe pain or discomfort caused by hemorrhoids. Use hemorrhoid cream if your doctor approves. ?Wear a good support bra if your breasts are   tender. ?If you develop bulging, swollen veins in your legs: ?Wear support hose as told by your doctor. ?Raise your feet for 15 minutes, 3-4 times a day. ?Limit salt in your food. ?Safety ?Talk to your doctor before traveling far distances. ?Do not use hot tubs, steam rooms, or saunas. ?Wear your seat belt at all times when you are in a car. ?Talk with your doctor if someone is hurting you or yelling at you a lot. ?Preparing for your baby's arrival ?To prepare for the arrival  of your baby: ?Take prenatal classes. ?Visit the hospital and tour the maternity area. ?Buy a rear-facing car seat. Learn how to install it in your car. ?Prepare the baby's room. Take out all pillows and stuffed animals from the baby's crib. ?General instructions ?Avoid cat litter boxes and soil used by cats. These carry germs that can cause harm to the baby and can cause a loss of your baby by miscarriage or stillbirth. ?Do not douche or use tampons. Do not use scented sanitary pads. ?Do not smoke or use any products that contain nicotine or tobacco. If you need help quitting, ask your doctor. ?Do not drink alcohol. ?Do not use herbal medicines, illegal drugs, or medicines that were not approved by your doctor. Chemicals in these products can affect your baby. ?Keep all follow-up visits. This is important. ?Where to find more information ?American Pregnancy Association: americanpregnancy.org ?American College of Obstetricians and Gynecologists: www.acog.org ?Office on Women's Health: womenshealth.gov/pregnancy ?Contact a doctor if: ?You have a fever. ?You have mild cramps or pressure in your lower belly. ?You have a nagging pain in your belly area. ?You vomit, or you have watery poop (diarrhea). ?You have bad-smelling fluid coming from your vagina. ?You have pain when you pee, or your pee smells bad. ?You have a headache that does not go away when you take medicine. ?You have changes in how you see, or you see spots in front of your eyes. ?Get help right away if: ?Your water breaks. ?You have regular contractions that are less than 5 minutes apart. ?You are spotting or bleeding from your vagina. ?You have very bad belly cramps or pain. ?You have trouble breathing. ?You have chest pain. ?You faint. ?You have not felt the baby move for the amount of time told by your doctor. ?You have new or increased pain, swelling, or redness in an arm or leg. ?Summary ?The third trimester is from week 28 through week 40 (months 7  through 9). This is the time when your unborn baby is growing very fast. ?During this time, your discomfort may increase as you gain weight and as your baby grows. ?Get ready for your baby to arrive by taking prenatal classes, buying a rear-facing car seat, and preparing the baby's room. ?Get help right away if you are bleeding from your vagina, you have chest pain and trouble breathing, or you have not felt the baby move for the amount of time told by your doctor. ?This information is not intended to replace advice given to you by your health care provider. Make sure you discuss any questions you have with your health care provider. ?Document Revised: 07/05/2019 Document Reviewed: 05/11/2019 ?Elsevier Patient Education ? 2023 Elsevier Inc. ? ?

## 2021-05-31 LAB — CBC WITH DIFF/PLATELET
Basophils Absolute: 0 10*3/uL (ref 0.0–0.2)
Basos: 0 %
EOS (ABSOLUTE): 0.2 10*3/uL (ref 0.0–0.4)
Eos: 2 %
Hematocrit: 37.3 % (ref 34.0–46.6)
Hemoglobin: 12.7 g/dL (ref 11.1–15.9)
Immature Grans (Abs): 0 10*3/uL (ref 0.0–0.1)
Immature Granulocytes: 0 %
Lymphocytes Absolute: 1.4 10*3/uL (ref 0.7–3.1)
Lymphs: 13 %
MCH: 28.3 pg (ref 26.6–33.0)
MCHC: 34 g/dL (ref 31.5–35.7)
MCV: 83 fL (ref 79–97)
Monocytes Absolute: 0.5 10*3/uL (ref 0.1–0.9)
Monocytes: 5 %
Neutrophils Absolute: 8.7 10*3/uL — ABNORMAL HIGH (ref 1.4–7.0)
Neutrophils: 80 %
Platelets: 162 10*3/uL (ref 150–450)
RBC: 4.48 x10E6/uL (ref 3.77–5.28)
RDW: 15.4 % (ref 11.7–15.4)
WBC: 10.8 10*3/uL (ref 3.4–10.8)

## 2021-05-31 LAB — GLUCOSE TOLERANCE, 1 HOUR: Glucose, 1Hr PP: 148 mg/dL (ref 70–199)

## 2021-05-31 LAB — RPR: RPR Ser Ql: NONREACTIVE

## 2021-06-05 ENCOUNTER — Other Ambulatory Visit: Payer: Self-pay

## 2021-06-05 DIAGNOSIS — Z131 Encounter for screening for diabetes mellitus: Secondary | ICD-10-CM

## 2021-06-05 NOTE — Progress Notes (Signed)
LVM and sent mychart message for patient.  ?

## 2021-06-05 NOTE — Progress Notes (Signed)
Order placed

## 2021-06-05 NOTE — Progress Notes (Signed)
Megan Sullivan: ? Abnormal 1 hr GCT - she needs to have a 3 hr GTT - please contact her to schedule.

## 2021-06-10 ENCOUNTER — Other Ambulatory Visit: Payer: 59

## 2021-06-10 DIAGNOSIS — Z131 Encounter for screening for diabetes mellitus: Secondary | ICD-10-CM | POA: Diagnosis not present

## 2021-06-11 LAB — GESTATIONAL GLUCOSE TOLERANCE
Glucose, Fasting: 85 mg/dL (ref 70–94)
Glucose, GTT - 1 Hour: 173 mg/dL (ref 70–179)
Glucose, GTT - 2 Hour: 190 mg/dL — ABNORMAL HIGH (ref 70–154)
Glucose, GTT - 3 Hour: 121 mg/dL (ref 70–139)

## 2021-06-17 ENCOUNTER — Ambulatory Visit: Payer: 59 | Attending: Obstetrics and Gynecology

## 2021-06-17 ENCOUNTER — Other Ambulatory Visit: Payer: Self-pay | Admitting: *Deleted

## 2021-06-17 ENCOUNTER — Ambulatory Visit: Payer: 59 | Admitting: *Deleted

## 2021-06-17 VITALS — BP 129/87 | HR 101

## 2021-06-17 DIAGNOSIS — O35EXX0 Maternal care for other (suspected) fetal abnormality and damage, fetal genitourinary anomalies, not applicable or unspecified: Secondary | ICD-10-CM

## 2021-06-17 DIAGNOSIS — O99212 Obesity complicating pregnancy, second trimester: Secondary | ICD-10-CM | POA: Diagnosis not present

## 2021-06-17 DIAGNOSIS — D573 Sickle-cell trait: Secondary | ICD-10-CM

## 2021-06-17 DIAGNOSIS — E669 Obesity, unspecified: Secondary | ICD-10-CM | POA: Diagnosis not present

## 2021-06-17 DIAGNOSIS — O99213 Obesity complicating pregnancy, third trimester: Secondary | ICD-10-CM

## 2021-06-17 DIAGNOSIS — O402XX0 Polyhydramnios, second trimester, not applicable or unspecified: Secondary | ICD-10-CM | POA: Diagnosis not present

## 2021-06-17 DIAGNOSIS — Z363 Encounter for antenatal screening for malformations: Secondary | ICD-10-CM

## 2021-06-17 DIAGNOSIS — Z6841 Body Mass Index (BMI) 40.0 and over, adult: Secondary | ICD-10-CM | POA: Diagnosis not present

## 2021-06-17 DIAGNOSIS — O285 Abnormal chromosomal and genetic finding on antenatal screening of mother: Secondary | ICD-10-CM

## 2021-06-17 DIAGNOSIS — Z3A30 30 weeks gestation of pregnancy: Secondary | ICD-10-CM | POA: Diagnosis not present

## 2021-06-18 ENCOUNTER — Ambulatory Visit (INDEPENDENT_AMBULATORY_CARE_PROVIDER_SITE_OTHER): Payer: 59 | Admitting: Certified Nurse Midwife

## 2021-06-18 VITALS — BP 124/81 | HR 88 | Wt 272.6 lb

## 2021-06-18 DIAGNOSIS — Z3483 Encounter for supervision of other normal pregnancy, third trimester: Secondary | ICD-10-CM

## 2021-06-18 LAB — POCT URINALYSIS DIPSTICK OB
Bilirubin, UA: NEGATIVE
Blood, UA: NEGATIVE
Glucose, UA: NEGATIVE
Ketones, UA: NEGATIVE
Leukocytes, UA: NEGATIVE
Nitrite, UA: NEGATIVE
POC,PROTEIN,UA: NEGATIVE
Spec Grav, UA: 1.015 (ref 1.010–1.025)
Urobilinogen, UA: 0.2 E.U./dL
pH, UA: 6 (ref 5.0–8.0)

## 2021-06-18 NOTE — Patient Instructions (Signed)
[facility]  [facility]  [address], [place], [place] [address]  Phone: [phone]  [facility] (second location)  [address] [place], [place] [address]  Phone: [phone]  [facility] ([place]) [address], [place], [place] [address] Phone: [phone]  [facility]  [address] [place], [place] [address]  Phone: [phone] 

## 2021-06-18 NOTE — Progress Notes (Signed)
ROB doing well, meet and greet with Midwives. Discussed her birth plan. She states she had an epidural with her first but is planing of waiting and seeing if she can do it without on this time. She feels good movement and denies contractions. She had MFM u/s for growth on 06/17/20 :  ?U/s results  ?Good fetal movement and polyhydramnios ? Fetal hydronephrosis Rt 11 mm and Lt 21 mm. SFU grade 3- ? 4  was noted. ?---------------------------------------------------------------------- ?Recommendations ? Follow up growth, assess fetal kidneys and amniotic fliud in 3 ? weeks ? Initiate weekly testing at 34 weeks due to maternal BMI. ? Early postnatal evaluation of the fetal kidneys recommended ? ?Reviewed with pt . She verbalizes understanding. She will follow up 2 wk with Missy to meet , then return to seeing MDs for subsequent visits.  ? ?Doreene Burke, CNM  ?

## 2021-07-03 ENCOUNTER — Encounter: Payer: Self-pay | Admitting: Obstetrics

## 2021-07-03 ENCOUNTER — Ambulatory Visit (INDEPENDENT_AMBULATORY_CARE_PROVIDER_SITE_OTHER): Payer: 59 | Admitting: Obstetrics

## 2021-07-03 VITALS — BP 127/78 | HR 102 | Wt 275.0 lb

## 2021-07-03 DIAGNOSIS — Z3483 Encounter for supervision of other normal pregnancy, third trimester: Secondary | ICD-10-CM

## 2021-07-03 NOTE — Progress Notes (Signed)
ROB at [redacted]w[redacted]d. Active baby. Denies ctx, LOF, vaginal bleeding. Will start NSTs at 34 weeks. Being followed by MFM for fetal hydronephrosis. Reviewed when to go to the hospital. RTC in 2 weeks.  Lloyd Huger, CNM

## 2021-07-11 ENCOUNTER — Ambulatory Visit: Payer: 59 | Attending: Obstetrics | Admitting: Obstetrics

## 2021-07-11 ENCOUNTER — Ambulatory Visit: Payer: 59 | Attending: Maternal & Fetal Medicine

## 2021-07-11 ENCOUNTER — Encounter: Payer: Self-pay | Admitting: *Deleted

## 2021-07-11 ENCOUNTER — Ambulatory Visit: Payer: 59 | Admitting: *Deleted

## 2021-07-11 VITALS — BP 119/78 | HR 101

## 2021-07-11 DIAGNOSIS — O35EXX0 Maternal care for other (suspected) fetal abnormality and damage, fetal genitourinary anomalies, not applicable or unspecified: Secondary | ICD-10-CM

## 2021-07-11 DIAGNOSIS — Z3A33 33 weeks gestation of pregnancy: Secondary | ICD-10-CM

## 2021-07-11 DIAGNOSIS — Z6841 Body Mass Index (BMI) 40.0 and over, adult: Secondary | ICD-10-CM

## 2021-07-11 DIAGNOSIS — O402XX0 Polyhydramnios, second trimester, not applicable or unspecified: Secondary | ICD-10-CM | POA: Diagnosis not present

## 2021-07-11 DIAGNOSIS — D573 Sickle-cell trait: Secondary | ICD-10-CM

## 2021-07-11 DIAGNOSIS — E669 Obesity, unspecified: Secondary | ICD-10-CM

## 2021-07-11 DIAGNOSIS — O99213 Obesity complicating pregnancy, third trimester: Secondary | ICD-10-CM | POA: Insufficient documentation

## 2021-07-11 DIAGNOSIS — O99013 Anemia complicating pregnancy, third trimester: Secondary | ICD-10-CM | POA: Diagnosis not present

## 2021-07-11 NOTE — Progress Notes (Signed)
MFM Note  Megan Sullivan was seen for a follow-up exam due to maternal obesity with a BMI of 40 and polyhydramnios noted on her last exam.  The fetus has also been noted to have bilateral hydronephrosis.    She denies any problems since her last exam and has screened negative for gestational diabetes.  The overall EFW of 6 pounds measures at the 95th percentile.  Polyhydramnios continues to be noted today.  Fetal movements were noted throughout today's exam.  Bilateral hydronephrosis continues to be noted on today's exam.  The right fetal kidney was 1.2 cm dilated and the left fetal kidney was 2.95 cm dilated. (Normal is 0.7 cm or less)  A normal appearing filled fetal bladder was noted today indicating that at least one or both of the fetal kidneys are functioning normally.  The implications and management of hydronephrosis was discussed with the patient.   She was advised that the renal pelvis dilatation noted on prenatal ultrasounds will often resolve spontaneously after birth.  However based on the large size of the renal pelvis dilatation that has been noted, her baby will most likely require treatment after birth.    Processes such as an obstruction or reflux causing causing the significant renal pelvis dilatation was discussed.  Her baby will require further imaging studies such as a VCUG after birth to determine the cause of the significant renal pelvis dilatation.  A referral to pediatric urology may also be necessary.    I will present her case at the next [facility] meeting to make the pediatricians aware of her case as the patient is most likely going to be delivering at [facility].  Her baby may need to be placed on prophylactic antibiotics after birth.  Due to maternal obesity, she will return in 1 week for a BPP.    The patient stated that all of her questions have been answered.  A total of 20 minutes was spent counseling and coordinating the care for this patient.  Greater than 50% of  the time was spent in direct face-to-face contact.

## 2021-07-15 ENCOUNTER — Ambulatory Visit: Payer: 59 | Attending: Maternal & Fetal Medicine

## 2021-07-15 ENCOUNTER — Ambulatory Visit: Payer: 59 | Admitting: *Deleted

## 2021-07-15 ENCOUNTER — Other Ambulatory Visit: Payer: Self-pay | Admitting: *Deleted

## 2021-07-15 ENCOUNTER — Encounter: Payer: Self-pay | Admitting: *Deleted

## 2021-07-15 VITALS — BP 130/80 | HR 93

## 2021-07-15 DIAGNOSIS — O99213 Obesity complicating pregnancy, third trimester: Secondary | ICD-10-CM | POA: Insufficient documentation

## 2021-07-15 DIAGNOSIS — Z6841 Body Mass Index (BMI) 40.0 and over, adult: Secondary | ICD-10-CM | POA: Insufficient documentation

## 2021-07-15 DIAGNOSIS — O99013 Anemia complicating pregnancy, third trimester: Secondary | ICD-10-CM | POA: Diagnosis not present

## 2021-07-15 DIAGNOSIS — D573 Sickle-cell trait: Secondary | ICD-10-CM | POA: Diagnosis not present

## 2021-07-15 DIAGNOSIS — E669 Obesity, unspecified: Secondary | ICD-10-CM

## 2021-07-15 DIAGNOSIS — O402XX0 Polyhydramnios, second trimester, not applicable or unspecified: Secondary | ICD-10-CM | POA: Diagnosis not present

## 2021-07-15 DIAGNOSIS — O35EXX0 Maternal care for other (suspected) fetal abnormality and damage, fetal genitourinary anomalies, not applicable or unspecified: Secondary | ICD-10-CM

## 2021-07-15 DIAGNOSIS — Z3A34 34 weeks gestation of pregnancy: Secondary | ICD-10-CM

## 2021-07-17 ENCOUNTER — Ambulatory Visit (INDEPENDENT_AMBULATORY_CARE_PROVIDER_SITE_OTHER): Payer: No Typology Code available for payment source | Admitting: Obstetrics and Gynecology

## 2021-07-17 ENCOUNTER — Encounter: Payer: Self-pay | Admitting: Obstetrics and Gynecology

## 2021-07-17 VITALS — BP 126/89 | HR 105 | Wt 273.8 lb

## 2021-07-17 DIAGNOSIS — Z3A34 34 weeks gestation of pregnancy: Secondary | ICD-10-CM

## 2021-07-17 DIAGNOSIS — Z3483 Encounter for supervision of other normal pregnancy, third trimester: Secondary | ICD-10-CM

## 2021-07-17 LAB — POCT URINALYSIS DIPSTICK OB
Bilirubin, UA: NEGATIVE
Blood, UA: NEGATIVE
Glucose, UA: NEGATIVE
Ketones, UA: NEGATIVE
Leukocytes, UA: NEGATIVE
Nitrite, UA: NEGATIVE
POC,PROTEIN,UA: NEGATIVE
Spec Grav, UA: >=1.03 — AB (ref 1.010–1.025)
Urobilinogen, UA: 0.2 E.U./dL
pH, UA: 6 (ref 5.0–8.0)

## 2021-07-17 NOTE — Progress Notes (Signed)
ROB. Patient states fetal movement with occasional braxton hicks. She states continuing to follow-up with MFM, had NST 07/15/21. Patient states no questions or concerns at this time.

## 2021-07-17 NOTE — Progress Notes (Signed)
ROB: Discussed most recent ultrasound findings.  Patient will have weekly NSTs. (Discussed with Dr. Valentino Saxon)  plan early delivery for elevated BMI after 39 weeks.  Anesthesia consult for BMI greater than 45 ordered.  Patient reports daily fetal movement.  Kick counts discussed.  Polyhydramnios improved after last ultrasound.  Baby will need pediatric follow-up after birth.

## 2021-07-25 ENCOUNTER — Ambulatory Visit: Payer: 59

## 2021-07-29 ENCOUNTER — Other Ambulatory Visit: Payer: No Typology Code available for payment source

## 2021-07-29 ENCOUNTER — Encounter
Admission: RE | Admit: 2021-07-29 | Discharge: 2021-07-29 | Disposition: A | Discharge status: Home / Self Care | Payer: No Typology Code available for payment source | Source: Ambulatory Visit | Attending: Urgent Care | Admitting: Urgent Care

## 2021-07-29 ENCOUNTER — Encounter: Payer: Self-pay | Admitting: Obstetrics

## 2021-07-29 ENCOUNTER — Ambulatory Visit (INDEPENDENT_AMBULATORY_CARE_PROVIDER_SITE_OTHER): Payer: No Typology Code available for payment source | Admitting: Obstetrics and Gynecology

## 2021-07-29 ENCOUNTER — Encounter: Payer: Self-pay | Admitting: Obstetrics and Gynecology

## 2021-07-29 ENCOUNTER — Observation Stay
Admission: RE | Admit: 2021-07-29 | Discharge: 2021-07-29 | Disposition: A | Discharge status: Home / Self Care | Payer: No Typology Code available for payment source | Attending: Obstetrics | Admitting: Obstetrics

## 2021-07-29 VITALS — BP 130/94 | HR 86 | Wt 278.4 lb

## 2021-07-29 DIAGNOSIS — Z3A36 36 weeks gestation of pregnancy: Secondary | ICD-10-CM | POA: Insufficient documentation

## 2021-07-29 DIAGNOSIS — O358XX0 Maternal care for other (suspected) fetal abnormality and damage, not applicable or unspecified: Secondary | ICD-10-CM | POA: Diagnosis present

## 2021-07-29 DIAGNOSIS — Z113 Encounter for screening for infections with a predominantly sexual mode of transmission: Secondary | ICD-10-CM

## 2021-07-29 DIAGNOSIS — O368330 Maternal care for abnormalities of the fetal heart rate or rhythm, third trimester, not applicable or unspecified: Secondary | ICD-10-CM

## 2021-07-29 DIAGNOSIS — Z3483 Encounter for supervision of other normal pregnancy, third trimester: Secondary | ICD-10-CM | POA: Diagnosis not present

## 2021-07-29 DIAGNOSIS — Z3685 Encounter for antenatal screening for Streptococcus B: Secondary | ICD-10-CM

## 2021-07-29 HISTORY — DX: Obesity, unspecified: E66.9

## 2021-07-29 HISTORY — DX: Polyhydramnios, unspecified trimester, not applicable or unspecified: O40.9XX0

## 2021-07-29 LAB — POCT URINALYSIS DIPSTICK OB
Bilirubin, UA: NEGATIVE
Blood, UA: NEGATIVE
Glucose, UA: NEGATIVE
Ketones, UA: NEGATIVE
Leukocytes, UA: NEGATIVE
Nitrite, UA: NEGATIVE
POC,PROTEIN,UA: NEGATIVE
Spec Grav, UA: 1.02 (ref 1.010–1.025)
Urobilinogen, UA: 0.2 E.U./dL
pH, UA: 5 (ref 5.0–8.0)

## 2021-07-29 NOTE — OB Triage Note (Signed)
Patient is G3P1011 at [redacted]w[redacted]d who presents to unit for a NST sent from the office. Patient reports+FM, occasional Deberah Pelton, and denies LOF and vaginal bleeding. External monitors applied and assessing. Initial FHT 140.

## 2021-07-29 NOTE — OB Triage Note (Signed)
Discharge instructions, labor precautions, and follow-up care reviewed with patient and significant other. All questions answered. Patient verbalized understanding. Discharged ambulatory off unit.   

## 2021-07-29 NOTE — OB Triage Note (Signed)
LABOR & DELIVERY OB TRIAGE NOTE  SUBJECTIVE  HPI Megan Sullivan is a 36 y.o. G3P1011 at [redacted]w[redacted]d who presents to Labor & Delivery for monitoring after a non-reactive NST in the office today.   OB History     Gravida  3   Para  1   Term  1   Preterm      AB  1   Living  1      SAB  1   IAB      Ectopic      Multiple  0   Live Births  1           Scheduled Meds: Continuous Infusions: PRN Meds:.  OBJECTIVE  BP 134/81   Pulse 95   Temp 98.4 F (36.9 C) (Oral)   Resp 19   Ht 5\' 5"  (1.651 m)   Wt 126.1 kg   LMP 11/19/2020 (Exact Date)   BMI 46.26 kg/m   NST I reviewed the NST and it was reactive.  Baseline: 140 Variability: moderate Accelerations: present, 15x15 Decelerations:none Toco: irregular ctx Category 1  ASSESSMENT  1) Reactive NST 2) Reassuring maternal/fetal status  PLAN  1) Discharge home with standard labor/return precautions 2) Keep scheduled ROB appointments  01/19/2021, CNM

## 2021-07-29 NOTE — Consult Note (Signed)
Perioperative Services  OB/GYN PRE-ANESTHESIA CONSULTATION  Date: 07/29/21  Patient Demographics:  Name: Donyae Kohn DOB:   November 24, 1985 MRN:   [id number]  NOTE: Available PAT nursing documentation and vital signs have been reviewed. Available clinical information reviewed in efforts to assist in medical decision making as it pertains to the aforementioned procedure and anticipated anesthetic course.   Pertinent OB/GYN Information:  Birth plan as of 07/29/21: patient plans to deliver via SVD . She advises that if were to deliver today, she would like to attempt SVD without epidural. Patient wants to have the potential of epidural placement for intrapartum analgesia as an option due to previous extended labor  OB History     Gravida  3   Para  1   Term  1   Preterm      AB  1   Living  1      SAB  1   IAB      Ectopic      Multiple  0   Live Births  1       Clinical Discussion:  Kailynne Roll is a 36 y.o. female presenting for pre-anesthesia evaluation prior to planned vaginal delivery with [place] at [place]. Patient is a 23w0dG3P1A1s with an EDC of 08/26/2021. Due to a documented history of perinatal obesity, she has been submitted for pre-delivery consultation with anesthesia team.  Patient has never been a smoker. She denies the use/abuse of any type of substances, including ETOH during this pregnancy. Pertinent PMH includes: migraine headaches, asthma, obesity, and polyhydramnios.   Patient was last seen in the OB/GYN clinic on 07/17/2021; notes reviewed. Intrauterine pregnancy (IUP) has been confirmed by her OB/GYN team via appropriate ultrasound imaging. She has given birth in the past via SVD with epidural support. She denies any known previous complications associated with her pregnancies and/or labor. In review of the notes from OB, there appears to be polyhydramnios noted on imaging. Additionally, fetus with pelviocaliceal fullness  (hydronephrosis) dating back to approximately [redacted] weeks gestation. Newborn will require further testing once delivered, along with possible antibiotic course. Patient advising that she has received appropriate prenatal care throughout her current pregnancy and verbalizes that everything is on track for her to deliver per her original birthing plan. She does note that OB providers have told her that she would likely be induced 1 week prior to her being full term. Patient has made the necessary preparations is this should be the case. Per patient report, she has gained approximately 30-35 lbs during the course of her pregnancy.   Patient advises that she not have any type of known issues with her cervical or lumbar spine.  She has not had any type of surgical procedures, whether with or without hardware placement, on her neck or lower back.   Patient denies previous perioperative complications with anesthesia in the past. She [facility] general [facility] a RIGHT ovarian cyst [facility] (2017[facility]   She has never experienced difficulties with endotracheal intubation. She advises that she has never been advised by a medical provider that she had a challenging airway due to her anatomy.   Patient reports that she has undergone previous uncomplicated epidurals and/or neuraxial anesthesia in the past. Epidural was placed here in 2020 labor support with her daughter. She notes that procedure "was great" and that "everything went wonderful and smoothly".   Patient has never been diagnosed with OSAH syndrome requiring the use  of prescribed nocturnal PAP therapy.   In review the patient's available EMR, it is noted that she underwent a general anesthetic course via LMA (ASA II) at [facility] with no documented complications.   Providers/Specialists:   NOTE: Primary physician provider listed below. Patient may have been seen by APP or partner within same practice.    PROVIDER ROLE / SPECIALTY LAST OV  [facility] [place] (Surgeon) 07/17/2021  Pcp, No Primary Care Provider N/A   Allergies:  Patient has no known allergies.  Current Home Medications:    Butalbital-APAP-Caffeine 50-325-40 MG capsule   Prenatal MV & Min w/FA-DHA (ONE A DAY PRENATAL PO)   History:   Past Medical History:  Diagnosis Date   Asthma    Migraines    Past Surgical History:  Procedure Laterality Date   DILATION AND CURETTAGE OF UTERUS  2018   OVARIAN CYST REMOVAL  2017   Family History  Problem Relation Age of Onset   Hyperlipidemia Father    Diabetes Maternal Grandmother    Healthy Mother    Breast cancer Neg Hx    Ovarian cancer Neg Hx    Colon cancer Neg Hx    Social History   Tobacco Use   Smoking status: Never   Smokeless tobacco: Never  Vaping Use   Vaping Use: Never used  Substance Use Topics   Alcohol use: Never   Drug use: Never    Review of Systems:  Constitutional: Negative.   HENT: Negative.    Eyes: Negative.   Respiratory:  Negative for cough, shortness of breath and wheezing.   Cardiovascular:  Negative for chest pain, palpitations and leg swelling.  Gastrointestinal: Negative.  Negative for abdominal pain, heartburn, nausea and vomiting.  Musculoskeletal:  Negative for back pain, falls and neck pain.  Skin:  Negative for rash.  Neurological:  Negative for dizziness and headaches.  Endo/Heme/Allergies: Negative.   Psychiatric/Behavioral:  Negative for depression, substance abuse and suicidal ideas. The patient is not nervous/anxious.  Physical Examination:  Vital signs that were obtained during visit have been reviewed as follows:  Estimated body mass index is 46.31 kg/m as calculated from the following:   Height as of this encounter: 5' 5"  (1.651 m).   Weight as of this encounter: 126.2 kg.  Constitutional:      Appearance: Normal appearance.  HENT:     Head: Normocephalic and atraumatic.      Mouth/Throat:     Mouth: Mucous membranes are moist.     Pharynx: Oropharynx is clear.     Comments: Mallampati: Class 2: Can visualize soft palate and fauces, tip of uvula is obscured.           Thyromental distance: > 3 FB/cm  Neck:     Comments: Supple. FROM without stiffness or pain. Able to touch chin to chest. Cardiovascular:     Rate and Rhythm: Normal rate and regular rhythm.     Pulses: Normal pulses.     Heart sounds: Normal heart sounds.  Pulmonary:     Effort: Pulmonary effort is normal.     Breath sounds: Normal breath sounds.  Abdominal:     Comments: (+) gravid abdomen without complaints of pain. (+) fetal movement; level of activity at baseline (not decreased).  Genitourinary:    Comments: Exam deferred; denies vaginal pain/discharge/bleeding.  Musculoskeletal:        General: No swelling. Normal range of motion.     Cervical back: Neck supple.  No rigidity or tenderness.  Skin:    General: Skin is warm and dry.     Findings: No rash.  Neurological:     General: No focal deficit present.     Mental Status: She is alert and oriented to person, place, and time. Mental status is at baseline.     Gait: Gait normal.  Psychiatric:        Mood and Affect: Mood normal.        Behavior: Behavior normal.        Thought Content: Thought content normal.        Judgment: Judgment normal.   Pertinent Clinical Results:   Lab Results  Component Value Date   WBC 10.8 05/30/2021   HGB 12.7 05/30/2021   HCT 37.3 05/30/2021   MCV 83 05/30/2021   PLT 162 05/30/2021   Lab Results  Component Value Date   NA 138 02/12/2021   K 3.9 02/12/2021   CO2 22 02/12/2021   GLUCOSE 89 02/12/2021   BUN 8 02/12/2021   CREATININE 0.74 02/12/2021   CALCIUM 9.0 02/12/2021   EGFR 109 02/12/2021   GFRNONAA 73 12/28/2019    ECG: -No previous ECG on file for provider review at the time of patient consult.   IMAGING / PROCEDURES: [place] MFM FETAL BPP WO NON STRESS performed on  07/15/2021 Num Of Fetuses: 1 Fetal Heart Rate: 150 bpm  Cardiac Activity: Observed Presentation: Cephalic Placenta: Posterior P. Cord Insertion: Previously Visualized AFI FV: Within normal limits AFI Sum: 16.07 cm      %Tile: 58        Largest Pocket: 5.65 cm  Impression and Plan:  Shynice Slight has been referred for pre-anesthesia review and clearance due to perinatal obesity. She was seen today at 25w0dgestation with a body mass index 46.31 kg/m. Available labs, pertinent testing, and imaging results were personally reviewed by me at the time of consult. Past medical/surgical history reviewed and discussed with patient. If noted to be applicable to patient, previous anesthetic courses discussed to determine past history of complications associated with her receiving anesthesia.   Analgesic options during labor were reviewed with patient. Typically epidural anesthesia is utilized, however this is based on patient preference and ability of anesthesia to successfully place an epidural catheter. Discussed epidural anesthesia and the potential challenges that are associated with this type of anesthesia. Placement is more difficult in patients who have a higher BMI, and even in placement is successful, there is increased risk of catheter migration from the epidural space. Additionally, discussed both neuraxial and general anesthetic courses should Cesarean delivery become necessary due to decompensation of maternal and/or fetal condition, or should this delivery option be a part of this patient's original birth plan. Given patient's BMI, she is at increased risk of intubation difficulties during her pregnancy. Reassurance provided regarding the skill set of our anesthesia team. Presented advanced airway technology options (i.e. video laryngoscopy, fiberoptic bronchoscope, etc.) that we have at our disposal that can help mitigate potential complications associated with securing a definitive airway.  Discussed that epidural anesthesia is generally safe and that post procedural back pain is often related to posture and weight changes. Reviewed risks associated with epidural/neuraxial anesthesia including spinal/epidural hematoma, infection, nerve damage, PDPH, hypotension, generalized pruritis, and nausea. Allowed time for questions, which were fielded to patient's satisfaction. She verbalized understanding of what was reviewed today.   Following meeting with patient today, she does not have any significant findings that would preclude her  from delivering here at [facility]. Note, her Body mass index of 46.31 kg/m does present challenges as discussed above. However, given her Mallampati score of 2, this patient is deemed to be safe to deliver here at our facility. Based on clinical review and physical exam performed today (07/29/21), the patient does not need to be seen back by anesthesia team prior to delivery. With that being said, patient will meet with anesthesia team (MD and/or CRNA) on this day of her procedure for further evaluation/assessment prior to her delivery.   The anesthesia team is appreciative of her OB/GYN team for involving our service early in this patient's care. Early consultation allows for pre-delivery planning and risk stratification. This ultimately promotes and ensures that our mutual patient will receive the safest and most effective care possible. So again, we appreciate the consult. Please reach out with any questions or concerns that arise during Uriel Hollinsworth's perinatal course. We look forward to working with everyone through this patient's delivery.   Honor Loh, MSN, APRN, FNP-C, CEN [facility]  Peri-operative Services Nurse Practitioner Phone: [phone] Fax: [phone] 07/29/21 12:37 PM  NOTE: This note has been prepared using Dragon dictation software. Despite my best ability to proofread, there is  always the potential that unintentional transcriptional errors may still occur from this process.

## 2021-07-29 NOTE — Progress Notes (Signed)
ROB: NST not reactive today. ?  Variables.  Cultures performed GC/CT-GBS.  Patient to labor and delivery after lunch for NST.  Plan weekly follow-up as scheduled if NST becomes reactive later today.  Had anesthesia consult.

## 2021-07-31 ENCOUNTER — Other Ambulatory Visit: Payer: 59

## 2021-07-31 LAB — STREP GP B NAA: Strep Gp B NAA: POSITIVE — AB

## 2021-08-01 LAB — GC/CHLAMYDIA PROBE AMP
Chlamydia trachomatis, NAA: NEGATIVE
Neisseria Gonorrhoeae by PCR: NEGATIVE

## 2021-08-06 ENCOUNTER — Ambulatory Visit (INDEPENDENT_AMBULATORY_CARE_PROVIDER_SITE_OTHER): Payer: No Typology Code available for payment source | Admitting: Obstetrics and Gynecology

## 2021-08-06 ENCOUNTER — Encounter: Payer: Self-pay | Admitting: Obstetrics and Gynecology

## 2021-08-06 ENCOUNTER — Other Ambulatory Visit: Payer: No Typology Code available for payment source

## 2021-08-06 VITALS — BP 131/87 | HR 105 | Wt 280.7 lb

## 2021-08-06 DIAGNOSIS — Z3483 Encounter for supervision of other normal pregnancy, third trimester: Secondary | ICD-10-CM

## 2021-08-06 DIAGNOSIS — Z3A37 37 weeks gestation of pregnancy: Secondary | ICD-10-CM

## 2021-08-06 LAB — FETAL NONSTRESS TEST

## 2021-08-06 NOTE — Progress Notes (Signed)
ROB: No complaints.  NST reactive today.  Anesthesia consult done last week.

## 2021-08-06 NOTE — Progress Notes (Signed)
ROB. Patient states fetal movement. She is aware she is GBS+. NST preformed today. Patient states no questions or concerns at this time.

## 2021-08-07 ENCOUNTER — Ambulatory Visit: Payer: 59

## 2021-08-08 ENCOUNTER — Other Ambulatory Visit: Payer: 59

## 2021-08-08 ENCOUNTER — Ambulatory Visit: Payer: 59

## 2021-08-08 ENCOUNTER — Other Ambulatory Visit: Payer: Self-pay

## 2021-08-13 ENCOUNTER — Other Ambulatory Visit: Payer: No Typology Code available for payment source

## 2021-08-13 ENCOUNTER — Encounter: Payer: Self-pay | Admitting: Obstetrics and Gynecology

## 2021-08-13 ENCOUNTER — Ambulatory Visit (INDEPENDENT_AMBULATORY_CARE_PROVIDER_SITE_OTHER): Payer: No Typology Code available for payment source | Admitting: Obstetrics and Gynecology

## 2021-08-13 VITALS — BP 132/90 | HR 105 | Wt 284.0 lb

## 2021-08-13 DIAGNOSIS — Z3483 Encounter for supervision of other normal pregnancy, third trimester: Secondary | ICD-10-CM

## 2021-08-13 DIAGNOSIS — Z3A38 38 weeks gestation of pregnancy: Secondary | ICD-10-CM

## 2021-08-13 LAB — POCT URINALYSIS DIPSTICK OB
Bilirubin, UA: NEGATIVE
Blood, UA: NEGATIVE
Glucose, UA: NEGATIVE
Ketones, UA: NEGATIVE
Leukocytes, UA: NEGATIVE
Nitrite, UA: NEGATIVE
POC,PROTEIN,UA: NEGATIVE
Spec Grav, UA: 1.01 (ref 1.010–1.025)
Urobilinogen, UA: 0.2 E.U./dL
pH, UA: 5 (ref 5.0–8.0)

## 2021-08-13 NOTE — Progress Notes (Signed)
ROB: Patient noting some Megan Sullivan.  Otherwise doing well. BP borderline today (diastolic), continue to monitor but likely developing gHTN as several borderline Bps noted over past several visits. NST performed today was reviewed and was found to be reactive.  Continue recommended antenatal testing and prenatal care. Discussed IOL for BMI, will schedule for 7/15. RTC in 1 week for final visit and NST.    NONSTRESS TEST INTERPRETATION  INDICATIONS: Obesity  FHR baseline: 135 bpm RESULTS:Reactive COMMENTS: contractions q 2-3 minutes   PLAN: 1. Continue fetal kick counts twice a day. 2. Continue antepartum testing as scheduled-weekly

## 2021-08-14 ENCOUNTER — Other Ambulatory Visit: Payer: 59

## 2021-08-14 ENCOUNTER — Ambulatory Visit: Payer: 59

## 2021-08-15 ENCOUNTER — Ambulatory Visit: Payer: 59

## 2021-08-19 ENCOUNTER — Other Ambulatory Visit: Payer: No Typology Code available for payment source

## 2021-08-19 ENCOUNTER — Ambulatory Visit (INDEPENDENT_AMBULATORY_CARE_PROVIDER_SITE_OTHER): Payer: No Typology Code available for payment source | Admitting: Obstetrics and Gynecology

## 2021-08-19 ENCOUNTER — Encounter: Payer: Self-pay | Admitting: Obstetrics and Gynecology

## 2021-08-19 VITALS — BP 133/88 | HR 98 | Wt 285.2 lb

## 2021-08-19 DIAGNOSIS — Z3483 Encounter for supervision of other normal pregnancy, third trimester: Secondary | ICD-10-CM

## 2021-08-19 DIAGNOSIS — Z3A39 39 weeks gestation of pregnancy: Secondary | ICD-10-CM

## 2021-08-19 LAB — POCT URINALYSIS DIPSTICK OB
Bilirubin, UA: NEGATIVE
Blood, UA: NEGATIVE
Glucose, UA: NEGATIVE
Ketones, UA: NEGATIVE
Leukocytes, UA: NEGATIVE
Nitrite, UA: NEGATIVE
POC,PROTEIN,UA: NEGATIVE
Spec Grav, UA: 1.02 (ref 1.010–1.025)
Urobilinogen, UA: 0.2 E.U./dL
pH, UA: 6 (ref 5.0–8.0)

## 2021-08-19 NOTE — Progress Notes (Signed)
ROB. Patient states fetal movement. She states her ankles have swollen the past few days. NST preformed.  Patient states no questions or concerns at this time.

## 2021-08-19 NOTE — Progress Notes (Signed)
ROB: Has occasional "Braxton Hicks contractions".  Feels daily fetal movement.  Scheduled by Dr. Valentino Saxon for induction in 2 days.  NST reactive irregular mild contractions present.  Declined cervical exam today

## 2021-08-23 ENCOUNTER — Inpatient Hospital Stay
Admission: EM | Admit: 2021-08-23 | Discharge: 2021-08-25 | DRG: 807 | Disposition: A | Discharge status: Home / Self Care | Payer: No Typology Code available for payment source | Attending: Obstetrics and Gynecology | Admitting: Obstetrics and Gynecology

## 2021-08-23 ENCOUNTER — Inpatient Hospital Stay: Payer: No Typology Code available for payment source | Admitting: Anesthesiology

## 2021-08-23 ENCOUNTER — Encounter: Payer: Self-pay | Admitting: Obstetrics and Gynecology

## 2021-08-23 ENCOUNTER — Other Ambulatory Visit: Payer: Self-pay

## 2021-08-23 DIAGNOSIS — O99820 Streptococcus B carrier state complicating pregnancy: Secondary | ICD-10-CM | POA: Diagnosis not present

## 2021-08-23 DIAGNOSIS — O99214 Obesity complicating childbirth: Principal | ICD-10-CM | POA: Diagnosis present

## 2021-08-23 DIAGNOSIS — O358XX0 Maternal care for other (suspected) fetal abnormality and damage, not applicable or unspecified: Secondary | ICD-10-CM | POA: Diagnosis present

## 2021-08-23 DIAGNOSIS — O403XX0 Polyhydramnios, third trimester, not applicable or unspecified: Secondary | ICD-10-CM | POA: Diagnosis present

## 2021-08-23 DIAGNOSIS — O99824 Streptococcus B carrier state complicating childbirth: Secondary | ICD-10-CM | POA: Diagnosis present

## 2021-08-23 DIAGNOSIS — D573 Sickle-cell trait: Secondary | ICD-10-CM | POA: Diagnosis present

## 2021-08-23 DIAGNOSIS — O3663X0 Maternal care for excessive fetal growth, third trimester, not applicable or unspecified: Secondary | ICD-10-CM | POA: Diagnosis present

## 2021-08-23 DIAGNOSIS — O9902 Anemia complicating childbirth: Secondary | ICD-10-CM | POA: Diagnosis present

## 2021-08-23 DIAGNOSIS — O99213 Obesity complicating pregnancy, third trimester: Secondary | ICD-10-CM | POA: Diagnosis present

## 2021-08-23 DIAGNOSIS — Z3A39 39 weeks gestation of pregnancy: Secondary | ICD-10-CM | POA: Diagnosis not present

## 2021-08-23 LAB — CBC
HCT: 38 % (ref 36.0–46.0)
Hemoglobin: 12.8 g/dL (ref 12.0–15.0)
MCH: 27.2 pg (ref 26.0–34.0)
MCHC: 33.7 g/dL (ref 30.0–36.0)
MCV: 80.7 fL (ref 80.0–100.0)
Platelets: 179 10*3/uL (ref 150–400)
RBC: 4.71 MIL/uL (ref 3.87–5.11)
RDW: 16.5 % — ABNORMAL HIGH (ref 11.5–15.5)
WBC: 12.3 10*3/uL — ABNORMAL HIGH (ref 4.0–10.5)
nRBC: 0 % (ref 0.0–0.2)

## 2021-08-23 LAB — TYPE AND SCREEN
ABO/RH(D): O POS
Antibody Screen: NEGATIVE

## 2021-08-23 LAB — RPR: RPR Ser Ql: NONREACTIVE

## 2021-08-23 MED ORDER — OXYTOCIN-SODIUM CHLORIDE 30-0.9 UT/500ML-% IV SOLN: 1.0000 m[IU]/min | INTRAVENOUS | Status: DC

## 2021-08-23 MED ORDER — FENTANYL CITRATE (PF) 100 MCG/2ML IJ SOLN: 50.0000 ug | INTRAMUSCULAR | Status: DC | PRN

## 2021-08-23 MED ORDER — OXYTOCIN BOLUS FROM INFUSION
333.0000 mL | Freq: Once | INTRAVENOUS | Status: AC
  Administered 2021-08-23: 333 mL via INTRAVENOUS

## 2021-08-23 MED ORDER — SODIUM CHLORIDE 0.9 % IV SOLN
5.0000 10*6.[IU] | Freq: Once | INTRAVENOUS | Status: AC
  Administered 2021-08-23: 5 10*6.[IU] via INTRAVENOUS
  Filled 2021-08-23: qty 5

## 2021-08-23 MED ORDER — ONDANSETRON HCL 4 MG/2ML IJ SOLN: 4.0000 mg | INTRAMUSCULAR | Status: DC | PRN

## 2021-08-23 MED ORDER — LACTATED RINGERS IV SOLN
500.0000 mL | INTRAVENOUS | Status: DC | PRN
  Administered 2021-08-23 (×2): 500 mL via INTRAVENOUS

## 2021-08-23 MED ORDER — OXYTOCIN-SODIUM CHLORIDE 30-0.9 UT/500ML-% IV SOLN
INTRAVENOUS | Status: AC
  Administered 2021-08-23: 2 m[IU]/min via INTRAVENOUS
  Filled 2021-08-23: qty 500

## 2021-08-23 MED ORDER — IBUPROFEN 600 MG PO TABS
600.0000 mg | ORAL_TABLET | Freq: Four times a day (QID) | ORAL | Status: DC
  Administered 2021-08-23 – 2021-08-25 (×8): 600 mg via ORAL
  Filled 2021-08-23 (×8): qty 1

## 2021-08-23 MED ORDER — LACTATED RINGERS IV SOLN: 500.0000 mL | Freq: Once | INTRAVENOUS | Status: DC

## 2021-08-23 MED ORDER — EPHEDRINE 5 MG/ML INJ: 10.0000 mg | INTRAVENOUS | Status: DC | PRN

## 2021-08-23 MED ORDER — ACETAMINOPHEN 325 MG PO TABS
650.0000 mg | ORAL_TABLET | ORAL | Status: DC | PRN
  Administered 2021-08-23 – 2021-08-25 (×4): 650 mg via ORAL
  Filled 2021-08-23 (×4): qty 2

## 2021-08-23 MED ORDER — ONDANSETRON HCL 4 MG/2ML IJ SOLN: 4.0000 mg | Freq: Four times a day (QID) | INTRAMUSCULAR | Status: DC | PRN

## 2021-08-23 MED ORDER — TERBUTALINE SULFATE 1 MG/ML IJ SOLN: 0.2500 mg | Freq: Once | INTRAMUSCULAR | Status: DC | PRN

## 2021-08-23 MED ORDER — DIPHENHYDRAMINE HCL 25 MG PO CAPS: 25.0000 mg | ORAL_CAPSULE | Freq: Four times a day (QID) | ORAL | Status: DC | PRN

## 2021-08-23 MED ORDER — FENTANYL-BUPIVACAINE-NACL 0.5-0.125-0.9 MG/250ML-% EP SOLN
EPIDURAL | Status: AC
  Filled 2021-08-23: qty 250

## 2021-08-23 MED ORDER — ONDANSETRON HCL 4 MG PO TABS: 4.0000 mg | ORAL_TABLET | ORAL | Status: DC | PRN

## 2021-08-23 MED ORDER — BENZOCAINE-MENTHOL 20-0.5 % EX AERO
1.0000 | INHALATION_SPRAY | CUTANEOUS | Status: DC | PRN
  Administered 2021-08-23: 1 via TOPICAL
  Filled 2021-08-23: qty 56

## 2021-08-23 MED ORDER — DIPHENHYDRAMINE HCL 50 MG/ML IJ SOLN: 12.5000 mg | INTRAMUSCULAR | Status: DC | PRN

## 2021-08-23 MED ORDER — LIDOCAINE HCL (PF) 1 % IJ SOLN: 30.0000 mL | INTRAMUSCULAR | Status: DC | PRN

## 2021-08-23 MED ORDER — ZOLPIDEM TARTRATE 5 MG PO TABS: 5.0000 mg | ORAL_TABLET | Freq: Every evening | ORAL | Status: DC | PRN

## 2021-08-23 MED ORDER — SENNOSIDES-DOCUSATE SODIUM 8.6-50 MG PO TABS
2.0000 | ORAL_TABLET | Freq: Every day | ORAL | Status: DC
  Administered 2021-08-24 – 2021-08-25 (×2): 2 via ORAL
  Filled 2021-08-23 (×2): qty 2

## 2021-08-23 MED ORDER — PENICILLIN G POT IN DEXTROSE 60000 UNIT/ML IV SOLN: 3.0000 10*6.[IU] | INTRAVENOUS | Status: DC

## 2021-08-23 MED ORDER — LACTATED RINGERS IV BOLUS
1000.0000 mL | Freq: Once | INTRAVENOUS | Status: AC
  Administered 2021-08-23: 1000 mL via INTRAVENOUS

## 2021-08-23 MED ORDER — MISOPROSTOL 200 MCG PO TABS
ORAL_TABLET | ORAL | Status: AC
  Administered 2021-08-23: 50 ug via VAGINAL
  Filled 2021-08-23: qty 4

## 2021-08-23 MED ORDER — FENTANYL-BUPIVACAINE-NACL 0.5-0.125-0.9 MG/250ML-% EP SOLN
12.0000 mL/h | EPIDURAL | Status: DC | PRN
  Administered 2021-08-23: 12 mL/h via EPIDURAL

## 2021-08-23 MED ORDER — SIMETHICONE 80 MG PO CHEW: 80.0000 mg | CHEWABLE_TABLET | ORAL | Status: DC | PRN

## 2021-08-23 MED ORDER — OXYCODONE-ACETAMINOPHEN 5-325 MG PO TABS: 2.0000 | ORAL_TABLET | ORAL | Status: DC | PRN

## 2021-08-23 MED ORDER — OXYTOCIN 10 UNIT/ML IJ SOLN
INTRAMUSCULAR | Status: DC
Start: 2021-08-23 — End: 2021-08-23
  Filled 2021-08-23: qty 2

## 2021-08-23 MED ORDER — OXYTOCIN-SODIUM CHLORIDE 30-0.9 UT/500ML-% IV SOLN: 2.5000 [IU]/h | INTRAVENOUS | Status: DC

## 2021-08-23 MED ORDER — LIDOCAINE HCL (PF) 1 % IJ SOLN
INTRAMUSCULAR | Status: AC
  Filled 2021-08-23: qty 30

## 2021-08-23 MED ORDER — LIDOCAINE HCL (PF) 1 % IJ SOLN
INTRAMUSCULAR | Status: DC | PRN
  Administered 2021-08-23: 1 mL via SUBCUTANEOUS

## 2021-08-23 MED ORDER — PHENYLEPHRINE 80 MCG/ML (10ML) SYRINGE FOR IV PUSH (FOR BLOOD PRESSURE SUPPORT): 80.0000 ug | PREFILLED_SYRINGE | INTRAVENOUS | Status: DC | PRN

## 2021-08-23 MED ORDER — MISOPROSTOL 25 MCG QUARTER TABLET: 25.0000 ug | ORAL_TABLET | ORAL | Status: DC | PRN

## 2021-08-23 MED ORDER — LIDOCAINE-EPINEPHRINE (PF) 1.5 %-1:200000 IJ SOLN
INTRAMUSCULAR | Status: DC | PRN
  Administered 2021-08-23: 3 mL via EPIDURAL

## 2021-08-23 MED ORDER — WITCH HAZEL-GLYCERIN EX PADS
1.0000 | MEDICATED_PAD | CUTANEOUS | Status: DC | PRN
  Administered 2021-08-23: 1 via TOPICAL
  Filled 2021-08-23: qty 100

## 2021-08-23 MED ORDER — LACTATED RINGERS IV SOLN: INTRAVENOUS | Status: DC

## 2021-08-23 MED ORDER — MISOPROSTOL 50MCG HALF TABLET
50.0000 ug | ORAL_TABLET | ORAL | Status: DC
  Administered 2021-08-23: 50 ug via VAGINAL
  Filled 2021-08-23 (×2): qty 1

## 2021-08-23 MED ORDER — DIBUCAINE (PERIANAL) 1 % EX OINT
1.0000 | TOPICAL_OINTMENT | CUTANEOUS | Status: DC | PRN
Start: 2021-08-23 — End: 2021-08-26

## 2021-08-23 MED ORDER — COCONUT OIL OIL: 1.0000 | TOPICAL_OIL | Status: DC | PRN

## 2021-08-23 MED ORDER — SODIUM CHLORIDE 0.9 % IV SOLN
INTRAVENOUS | Status: DC | PRN
  Administered 2021-08-23 (×2): 5 mL via EPIDURAL

## 2021-08-23 MED ORDER — OXYCODONE-ACETAMINOPHEN 5-325 MG PO TABS: 1.0000 | ORAL_TABLET | ORAL | Status: DC | PRN

## 2021-08-23 MED ORDER — ACETAMINOPHEN 325 MG PO TABS: 650.0000 mg | ORAL_TABLET | ORAL | Status: DC | PRN

## 2021-08-23 MED ORDER — AMMONIA AROMATIC IN INHA
RESPIRATORY_TRACT | Status: AC
  Filled 2021-08-23: qty 10

## 2021-08-23 MED ORDER — PRENATAL MULTIVITAMIN CH
1.0000 | ORAL_TABLET | Freq: Every day | ORAL | Status: DC
  Administered 2021-08-24 – 2021-08-25 (×2): 1 via ORAL
  Filled 2021-08-23 (×2): qty 1

## 2021-08-23 MED ORDER — PHENYLEPHRINE 80 MCG/ML (10ML) SYRINGE FOR IV PUSH (FOR BLOOD PRESSURE SUPPORT)
80.0000 ug | PREFILLED_SYRINGE | INTRAVENOUS | Status: DC | PRN
Start: 2021-08-23 — End: 2021-08-23

## 2021-08-23 MED ORDER — SOD CITRATE-CITRIC ACID 500-334 MG/5ML PO SOLN: 30.0000 mL | ORAL | Status: DC | PRN

## 2021-08-23 NOTE — Progress Notes (Signed)
Megan Sullivan is stable after delivery. Her attentive husband is at bedside. She is bonding with infant "Casimiro Needle" and performing skin to skin and breastfeeding. She has eaten and tolerated a regular diet. Epidural catheter removed by RN, tip intact, no bleeding noted at site. Pt is stable and ambulated to the bathroom, voided a sufficient amount, and tolerated activity well. She showered and preformed oral hygiene. She ambulated to mother/baby unit RM [id number] with all belongings for couplet care. Report given to [place], Charity fundraiser.

## 2021-08-23 NOTE — Discharge Instructions (Signed)

## 2021-08-23 NOTE — Anesthesia Preprocedure Evaluation (Signed)
Anesthesia Evaluation  Patient identified by MRN, date of birth, ID band Patient awake    Reviewed: Allergy & Precautions, H&P , NPO status , Patient's Chart, lab work & pertinent test results, reviewed documented beta blocker date and time   History of Anesthesia Complications Negative for: history of anesthetic complications  Airway Mallampati: I  TM Distance: >3 FB Neck ROM: full    Dental no notable dental hx.    Pulmonary neg shortness of breath, asthma , neg recent URI,    Pulmonary exam normal        Cardiovascular Exercise Tolerance: Good negative cardio ROS Normal cardiovascular exam     Neuro/Psych  Headaches, neg Seizures  Neuromuscular disease negative psych ROS   GI/Hepatic negative GI ROS, Neg liver ROS,   Endo/Other  neg diabetesMorbid obesity  Renal/GU negative Renal ROS  negative genitourinary   Musculoskeletal   Abdominal (+) + obese,   Peds  Hematology negative hematology ROS (+)   Anesthesia Other Findings Past Medical History: No date: Asthma No date: Migraines   Reproductive/Obstetrics (+) Pregnancy                             Anesthesia Physical  Anesthesia Plan  ASA: 2  Anesthesia Plan: Epidural   Post-op Pain Management:    Induction:   PONV Risk Score and Plan:   Airway Management Planned:   Additional Equipment:   Intra-op Plan:   Post-operative Plan:   Informed Consent: I have reviewed the patients History and Physical, chart, labs and discussed the procedure including the risks, benefits and alternatives for the proposed anesthesia with the patient or authorized representative who has indicated his/her understanding and acceptance.     Dental Advisory Given  Plan Discussed with: Anesthesiologist, CRNA and Surgeon  Anesthesia Plan Comments:         Anesthesia Quick Evaluation

## 2021-08-23 NOTE — Progress Notes (Signed)
Intrapartum Progress Note  S: Patient states that her just water broke. Beginning to feel slightly stronger contractions.   O: Blood pressure (!) 142/87, pulse 89, temperature 98.3 F (36.8 C), temperature source Oral, resp. rate 16, height 5\' 5"  (1.651 m), weight 129.4 kg, last menstrual period 11/19/2020, currently breastfeeding. Gen App: NAD, comfortable Abdomen: soft, gravid FHT: baseline 145 bpm.  Accels present.  Decels present - occasional shallow variable.  moderate in degree variability.   Tocometer: contractions q 1-4 minutes Cervix: 6/50-60/-3 Extremities: Nontender, no edema.  Pitocin: None  Labs:  No new labs  Assessment:  1: SIUP at [redacted]w[redacted]d 2. Obesity, BMI 47 3. GBS positive 4. Fetal hydronephrosis  Plan:  1. Continue with IOL.  Will start pitocin. Recently SROM'd with clear fluid.  2. PCN for GBS prophylaxis.  3. Fetal hydronephrosis, for further evaluation after delivery.    [redacted]w[redacted]d, MD 08/23/2021 1:45 PM

## 2021-08-23 NOTE — H&P (Signed)
Obstetric History and Physical  Megan Sullivan is a 36 y.o. G3P1011 with IUP at [redacted]w[redacted]d presenting for scheduled IOL for morbid obesity in pregnancy. Patient states she has been having  no contractions,  vaginal bleeding,  membranes, with active fetal movement.    Of note, fetus currently with significant bilateral hydronephrosis, was followed by MFM.    Prenatal Course Source of Care: [facility] with onset of care at 10 weeks Pregnancy complications or risks: Patient Active Problem List   Diagnosis Date Noted   Obesity affecting pregnancy in third trimester 08/23/2021   Labor and delivery, indication for care 07/29/2021   Obesity, morbid, BMI 40.0-49.9 (HCC) 06/28/2018   Sickle cell trait (HCC) 01/19/2018   She plans to breastfeed She desires oral contraceptives (estrogen/progesterone) for postpartum contraception.   Prenatal labs and studies: ABO, Rh: --/--/O POS (07/15 0146) Antibody: NEG (07/15 0146) Rubella: 2.36 (12/22 0854) RPR: NON REACTIVE (07/15 0146)  HBsAg: Negative (12/22 0854)  HIV: Non Reactive (12/22 0854)  KTG:YBWLSLHT/-- (06/20 1411) 1 hr Glucola  abnormal (early), 3 hr GTT normal x 2.  Genetic screening normal Anatomy US normal   Past Medical History:  Diagnosis Date   Asthma    Migraines    Obesity     Past Surgical History:  Procedure Laterality Date   DILATION AND CURETTAGE OF UTERUS  2018   OVARIAN CYST REMOVAL  2017    OB History  Gravida Para Term Preterm AB Living  3 1 1   1 1   SAB IAB Ectopic Multiple Live Births  1     0 1    # Outcome Date GA Lbr Len/2nd Weight Sex Delivery Anes PTL Lv  3 Current           2 Term 07/13/18 [redacted]w[redacted]d / 00:18 3100 g F Vag-Spont EPI  LIV  1 SAB             Social History   Socioeconomic History   Marital status: Married    Spouse name: Not on file   Number of children: Not on file   Years of education: Not on file   Highest education level: Not on file  Occupational History   Not on file   Tobacco Use   Smoking status: Never   Smokeless tobacco: Never  Vaping Use   Vaping Use: Never used  Substance and Sexual Activity   Alcohol use: Never   Drug use: Never   Sexual activity: Yes    Birth control/protection: None  Other Topics Concern   Not on file  Social History Narrative   Not on file   Social Determinants of Health   Financial Resource Strain: Not on file  Food Insecurity: Not on file  Transportation Needs: Not on file  Physical Activity: Inactive (11/16/2017)   Exercise Vital Sign    Days of Exercise per Week: 0 days    Minutes of Exercise per Session: 0 min  Stress: No Stress Concern Present (11/16/2017)   01/16/2018 of Occupational Health - Occupational Stress Questionnaire    Feeling of Stress : Not at all  Social Connections: Moderately Integrated (11/16/2017)   Social Connection and Isolation Panel [NHANES]    Frequency of Communication with Friends and Family: Three times a week    Frequency of Social Gatherings with Friends and Family: Once a week    Attends Religious Services: 1 to 4 times per year    Active Member of Clubs or Organizations: No  Attends Banker Meetings: Never    Marital Status: Married    Family History  Problem Relation Age of Onset   Hyperlipidemia Father    Diabetes Maternal Grandmother    Healthy Mother    Breast cancer Neg Hx    Ovarian cancer Neg Hx    Colon cancer Neg Hx     Medications Prior to Admission  Medication Sig Dispense Refill Last Dose   Butalbital-APAP-Caffeine 50-325-40 MG capsule Take 1-2 capsules by mouth every 6 (six) hours as needed for headache. 30 capsule 3 Past Week   Prenatal MV & Min w/FA-DHA (ONE A DAY PRENATAL PO)    08/22/2021    No Known Allergies  Review of Systems: Negative except for what is mentioned in HPI.  Physical Exam: BP 134/86 (BP Location: Right Arm)   Pulse 86   Temp 98.3 F (36.8 C) (Oral)   Resp 14   Ht 5\' 5"  (1.651 m)   Wt 129.4 kg   LMP  11/19/2020 (Exact Date)   BMI 47.46 kg/m  CONSTITUTIONAL: Well-developed, well-nourished female in no acute distress.  HENT:  Normocephalic, atraumatic, External right and left ear normal. Oropharynx is clear and moist EYES: Conjunctivae and EOM are normal. Pupils are equal, round, and reactive to light. No scleral icterus.  NECK: Normal range of motion, supple, no masses SKIN: Skin is warm and dry. No rash noted. Not diaphoretic. No erythema. No pallor. NEUROLOGIC: Alert and oriented to person, place, and time. Normal reflexes, muscle tone coordination. No cranial nerve deficit noted. PSYCHIATRIC: Normal mood and affect. Normal behavior. Normal judgment and thought content. CARDIOVASCULAR: Normal heart rate noted, regular rhythm RESPIRATORY: Effort and breath sounds normal, no problems with respiration noted ABDOMEN: Soft, nontender, nondistended, gravid. MUSCULOSKELETAL: Normal range of motion. No edema and no tenderness. 2+ distal pulses.  Cervical Exam: Dilatation 2.5 cm   Effacement 50%   Station ballotable   Presentation: cephalic FHT:  Baseline rate 140 bpm   Variability moderate  Accelerations present   Decelerations none. Review of chart revealed occasional late or spontaneous deceleration between 0200 and 0500.  Contractions: Every 2-4 mins, mild   Pertinent Labs/Studies:   Results for orders placed or performed during the hospital encounter of 08/23/21 (from the past 24 hour(s))  CBC     Status: Abnormal   Collection Time: 08/23/21  1:46 AM  Result Value Ref Range   WBC 12.3 (H) 4.0 - 10.5 K/uL   RBC 4.71 3.87 - 5.11 MIL/uL   Hemoglobin 12.8 12.0 - 15.0 g/dL   HCT 08/25/21 26.9 - 48.5 %   MCV 80.7 80.0 - 100.0 fL   MCH 27.2 26.0 - 34.0 pg   MCHC 33.7 30.0 - 36.0 g/dL   RDW 46.2 (H) 70.3 - 50.0 %   Platelets 179 150 - 400 K/uL   nRBC 0.0 0.0 - 0.2 %  Type and screen [facility]     Status: None   Collection Time: 08/23/21  1:46 AM  Result Value Ref  Range   ABO/RH(D) O POS    Antibody Screen NEG    Sample Expiration      08/26/2021,2359 Performed at [facility], [address] [place], [place]   RPR     Status: None   Collection Time: 08/23/21  1:46 AM  Result Value Ref Range   RPR Ser Ql NON REACTIVE NON REACTIVE    Imaging:  Narrative & Impression  ----------------------------------------------------------------------  OBSTETRICS REPORT                       (  Signed Final 07/11/2021 04:27 pm) ---------------------------------------------------------------------- Patient Info  ID #:       [id number]                          D.O.B.:  1985/06/13 (35 yrs)  Name:       Megan Sullivan                  Visit Date: 07/11/2021 07:34 am ---------------------------------------------------------------------- Performed By  Attending:        Ma Rings MD         Ref. Address:     [facility]                                                             [address]                                                             [address]                                                             [address]                                                             [place]                                                             [address]  Performed By:     Anabel Halon          [facility]  Referred By:      Linzie CollinAVID JAMES                    EVANS MD ---------------------------------------------------------------------- Orders  #  Description                           Code        Ordered By  1  US MFM OB FOLLOW UP                   [id number]    Lin LandsmanORENTHIAN                                                        BOOKER ----------------------------------------------------------------------  #  Order #                     Accession #                Episode #  1  [id number]                   [id number]                 [id number] ---------------------------------------------------------------------- Indications  Polyhydramnios, second trimester,              O40.2XX0  antepartum condition or complication, fetus  unspecified  Obesity complicating pregnancy, third          O99.213  trimester (BMI 40)  Abnormal fetal ultrasound (bilateral           O28.9  pyelectasis)  [redacted] weeks gestation of pregnancy                Z3A.33  Declined NIPS, AFP neg  Genetic carrier (Sickle Cell Trait)            Z14.8 ---------------------------------------------------------------------- Vital Signs                            Pulse:  101  BP:          119/78 ---------------------------------------------------------------------- Fetal Evaluation  Num Of Fetuses:         1  Fetal Heart Rate(bpm):  134  Cardiac Activity:       Observed  Presentation:           Cephalic  Placenta:               Posterior  P. Cord Insertion:      Previously Visualized  Amniotic Fluid  AFI FV:      Polyhydramnios  AFI Sum(cm)     %Tile       Largest Pocket(cm)  24.6            95          10.8  RUQ(cm)       RLQ(cm)  LUQ(cm)        LLQ(cm)  5.1           10.8          1.4            7.3 ---------------------------------------------------------------------- Biometry  BPD:     86.46  mm     G. Age:  34w 6d         84  %    CI:        78.88   %    70 - 86                                                          FL/HC:      21.7   %    19.9 - 21.5  HC:    307.83   mm     G. Age:  34w 2d         36  %    HC/AC:      0.94        0.96 - 1.11  AC:    327.33   mm     G. Age:  36w 5d       > 99  %    FL/BPD:     77.1   %    71 - 87  FL:      66.65  mm     G. Age:  34w 2d         63  %    FL/AC:      20.4   %    20 - 24  LV:        5.4   mm  Est. FW:    2721  gm           6 lb     95  % ---------------------------------------------------------------------- OB History  Gravidity:    3  Living:       1 ---------------------------------------------------------------------- Gestational Age  LMP:           33w 3d        Date:  11/19/20                 EDD:   08/26/21  U/S Today:     35w 0d                                        EDD:   08/15/21  Best:          33w 3d     Det. By:  LMP  (11/19/20)          EDD:   08/26/21 ---------------------------------------------------------------------- Anatomy  Cranium:               Appears normal         LVOT:                   Previously seen  Cavum:                 Appears normal         Aortic Arch:            Not well visualized  Ventricles:            Appears normal         Ductal Arch:            Previously seen  Choroid Plexus:        Previously seen        Diaphragm:              Appears normal  Cerebellum:            Previously seen        Stomach:                Appears normal, left                                                                        sided  Posterior Fossa:       Previously seen        Abdomen:                Appears normal  Nuchal Fold:           Not applicable (>20    Abdominal Wall:         Previously seen                         wks GA)  Face:                  Orbits and profile     Cord Vessels:           Previously seen                         previously seen  Lips:                  Previously seen        Kidneys:                Bilateral UTD  Palate:                Not well visualized    Bladder:                Appears normal  Thoracic:              Appears normal         Spine:                  Previously seen  Heart:                 Appears normal         Upper Extremities:      Previously seen                         (4CH, axis, and                         situs)  RVOT:                  Previously seen        Lower Extremities:  Previously  seen  Other:  Parents do not wish to know sex of fetus. 3VV previously visualized.          Nasal bone, lenses previously visualized. Technicallly difficult due to          advanced GA and maternal habitus. ---------------------------------------------------------------------- Cervix Uterus Adnexa  Cervix  Not visualized (advanced GA >24wks)  Uterus  No abnormality visualized.  Right Ovary  Within normal limits.  Left Ovary  Within normal limits.  Cul De Sac  No free fluid seen.  Adnexa  No abnormality visualized. ---------------------------------------------------------------------- Comments  Megan Sullivan was seen for a follow-up exam due to maternal  obesity with a BMI of 40 and polyhydramnios noted on her  last exam.  The fetus has also been noted to have bilateral  hydronephrosis.  She denies any problems since her last exam and has  screened negative for gestational diabetes.  The overall EFW of 6 pounds measures at the 95th  percentile.  Polyhydramnios continues to be noted today.  Fetal movements were noted throughout today's exam.  Bilateral hydronephrosis continues to be noted on today's  exam.  The right fetal kidney was 1.2 cm dilated and the left  fetal kidney was 2.95 cm dilated.  (Normal is 0.7 cm or less)  A normal appearing filled fetal bladder was noted today  indicating that at least one or both of the fetal kidneys are  functioning normally.  The implications and management of hydronephrosis was  discussed with the patient.  She was advised that the renal pelvis dilatation noted on  prenatal ultrasounds will often resolve spontaneously after  birth.  However based on the large size of the renal pelvis  dilatation that has been noted, her baby will most likely  require treatment after birth.  Processes such as an obstruction or reflux causing causing  the significant renal pelvis dilatation was discussed.  Her  baby will require further imaging studies  such as a VCUG  after birth to determine the cause of the significant renal  pelvis dilatation.  A referral to pediatric urology may also be  necessary.  I will present her case at the next [facility] meeting to make the  pediatricians aware of her case as the patient is most likely  going to be delivering at [facility].  Her baby may need to be  placed on prophylactic antibiotics after birth.  Due to maternal obesity, she will return in 1 week for a BPP.  The patient stated that all of her questions have been  answered.  A total of 20 minutes was spent counseling and coordinating  the care for this patient.  Greater than 50% of the time was  spent in direct face-to-face contact. ----------------------------------------------------------------------                   Ma Rings, MD Electronically Signed Final Report   07/11/2021 04:27 pm ----------------------------------------------------------------------    Assessment : Megan Sullivan is a 36 y.o. G3P1011 at [redacted]w[redacted]d being admitted for induction of labor due to obesity in pregnancy. Fetal hydronephrosis bilaterally. LGA infant (growth 95%ile 1 month ago)  Plan: Labor: Induction as ordered as per protocol. Received 1 dose of Cytotec overnight, second dose held given due to decelerations. Administered next Cytotec dosing and placed Cook's catheter as tracing more reassuring. Analgesia as needed. FWB: Reassuring fetal heart tracing.  GBS positive, PCN for prophylaxis. For renal studies post-delivery by Peds.  Delivery plan: Hopeful for vaginal delivery  Rubie Maid, MD [facility]

## 2021-08-23 NOTE — Anesthesia Procedure Notes (Addendum)
Epidural Patient location during procedure: OB Start time: 08/23/2021 2:12 PM End time: 08/23/2021 2:26 PM  Staffing Anesthesiologist: [name], MD Performed: anesthesiologist   Preanesthetic Checklist Completed: patient identified, IV checked, site marked, risks and benefits discussed, surgical consent, monitors and equipment checked, pre-op evaluation and timeout performed  Epidural Patient position: sitting Prep: ChloraPrep Patient monitoring: heart rate, continuous pulse ox and blood pressure Approach: midline Location: L3-L4 Injection technique: LOR saline  Needle:  Needle type: Tuohy  Needle gauge: 18 G Needle length: 9 cm Needle insertion depth: 8 cm Catheter type: closed end Catheter size: 20 Guage Catheter at skin depth: 12 cm Test dose: negative and 1.5% lidocaine with Epi 1:200 K  Assessment Events: blood not aspirated, injection not painful, no injection resistance, no paresthesia and negative IV test  Additional Notes Reason for block:procedure for pain

## 2021-08-23 NOTE — Progress Notes (Addendum)
Intrapartum Progress Note  S: Patient feels comfortable s/p epidural placement.   O: Blood pressure 128/79, pulse 88, temperature 98.3 F (36.8 C), temperature source Oral, resp. rate 16, height 5\' 5"  (1.651 m), weight 129.4 kg, last menstrual period 11/19/2020, SpO2 100 %, currently breastfeeding. Gen App: NAD, comfortable Abdomen: soft, gravid FHT: baseline 1 bpm.  Accels present.  Decels present - variable.  Moderate in degree variability.   Tocometer: contractions q 1-3 minutes Cervix: 8/80/0 Extremities: Nontender, no edema.  Pitocin: 4 mIU  Labs:  No new labs  Assessment:  1: SIUP at [redacted]w[redacted]d 2. Obesity, BMI 47 3. GBS positive 4. Fetal hydronephrosis  Plan:  1. Continue with IOL.  Continue Pitocin. Anticipate vaginal delivery.  2. PCN for GBS prophylaxis.  3. Fetal hydronephrosis, for further evaluation after delivery.    [redacted]w[redacted]d, MD 08/23/2021 3:48 PM

## 2021-08-24 NOTE — Anesthesia Postprocedure Evaluation (Signed)
Anesthesia Post Note  Patient: Megan Sullivan  Procedure(s) Performed: AN AD HOC LABOR EPIDURAL  Patient location during evaluation: Mother Baby Anesthesia Type: Epidural Level of consciousness: awake and alert Pain management: pain level controlled Vital Signs Assessment: post-procedure vital signs reviewed and stable Respiratory status: spontaneous breathing, nonlabored ventilation and respiratory function stable Cardiovascular status: blood pressure returned to baseline and stable Postop Assessment: no apparent nausea or vomiting Anesthetic complications: no   No notable events documented.   Last Vitals:  Vitals:   08/24/21 0939 08/24/21 1200  BP: 126/76 137/76  Pulse: 86 97  Resp: 20 18  Temp: 36.8 C 36.6 C  SpO2: 97% 98%    Last Pain:  Vitals:   08/24/21 1200  TempSrc: Oral  PainSc:                  [name]

## 2021-08-24 NOTE — Lactation Note (Signed)
This note was copied from a baby's chart. Lactation Consultation Note  Patient Name: Boy Marka Treloar [id number] Date: 08/24/2021 Reason for consult: Initial assessment Age:34 hours Lactation to the room for initial visit. Mother is holding the baby in football on the left. She was assisted with latch by Care RN previous to [facility] arrival. Encouraged feeding on demand and with cues. If baby is not cueing encouraged hand expression and skin to skin. Baby was left latched and feeding. He does require stimulation to suckle, has rhythmic sucking pattern with some swallows noted.  Encouraged 8 or more attempts in the first 24 hours and 8 or more good feeds after 24 HOL. Reviewed appropriate diapers for days of life and How to know your baby is getting enough to eat.  [facility] # left on board, encouraged to call for any assistance. Mother has no further questions at this time.   Maternal Data Has patient been taught Hand Expression?: Yes Does the patient have breastfeeding experience prior to this delivery?: Yes How long did the patient breastfeed?: 2 years  Feeding Mother's Current Feeding Choice: Breast Milk  LATCH Score Latch: Repeated attempts needed to sustain latch, nipple held in mouth throughout feeding, stimulation needed to elicit sucking reflex.  Audible Swallowing: A few with stimulation  Type of Nipple: Everted at rest and after stimulation  Comfort (Breast/Nipple): Soft / non-tender  Hold (Positioning): Assistance needed to correctly position infant at breast and maintain latch.  LATCH Score: 7   Interventions Interventions: Breast feeding basics reviewed;Hand express;Education  Discharge Pump: Personal (has a Medela pump in style at home)  Consult Status Consult Status: Follow-up Date: 08/25/21 Follow-up type: Call as needed    Diahann Guajardo D Lyna Laningham 08/24/2021, 10:57 AM

## 2021-08-24 NOTE — Progress Notes (Signed)
Post Partum Day # 1, s/p SVD  Subjective: no complaints, up ad lib, voiding, and tolerating PO. Plans to breastfeed. Notes bleeding is light.   Objective: Temp:  [98.1 F (36.7 C)-98.4 F (36.9 C)] 98.2 F (36.8 C) (07/16 0939) Pulse Rate:  [77-115] 86 (07/16 0939) Resp:  [14-20] 20 (07/16 0939) BP: (111-153)/(63-105) 126/76 (07/16 0939) SpO2:  [96 %-100 %] 97 % (07/16 0939)  Physical Exam:  General: alert and no distress  Lungs: clear to auscultation bilaterally Breasts: normal appearance, no masses or tenderness Heart: regular rate and rhythm, S1, S2 normal, no murmur, click, rub or gallop Abdomen: soft, non-tender; bowel sounds normal; no masses,  no organomegaly Pelvis: Lochia: appropriate, Uterine Fundus: firm Extremities: DVT Evaluation: No evidence of DVT seen on physical exam. Negative Homan's sign. No cords or calf tenderness. No significant calf/ankle edema.  Recent Labs    08/23/21 0146  HGB 12.8  HCT 38.0    Assessment/Plan: Doing well postpartum Breastfeeding, Lactation consult as needed.   Circumcision after discharge with Pediatrician Contraception: OCPs Plan for discharge late today or tomorrow pending fetal evaluation for hydronephrosis. BPs elevated towards end of labor and immediate postpartum.  Have improved this morning. Will continue to monitor and treat if needed.    LOS: 1 day   Hildred Laser, MD [facility] 08/24/2021 11:35 AM

## 2021-08-25 MED ORDER — IBUPROFEN 600 MG PO TABS: 600.0000 mg | ORAL_TABLET | Freq: Four times a day (QID) | ORAL | 0 refills | Status: DC

## 2021-08-25 NOTE — Discharge Summary (Signed)
Postpartum Discharge Summary      Patient Name: Megan Sullivan DOB: Dec 24, 1985 MRN: [id number]  Date of admission: 08/23/2021 Delivery date:08/23/2021  Delivering provider: Rubie Maid  Date of discharge: 08/25/2021  Admitting diagnosis: Obesity affecting pregnancy in third trimester [O99.213] Intrauterine pregnancy: [redacted]w[redacted]d    Secondary diagnosis:  Principal Problem:   Obesity affecting pregnancy in third trimester   Sickle cell trait  Fetal hydronephrosis affecting pregnancy  Additional problems: None    Discharge diagnosis: Term Pregnancy Delivered                                              Post partum procedures: N/A Augmentation: Pitocin, Cytotec, and IP Foley Complications: None  Hospital course: Induction of Labor With Vaginal Delivery   36y.o. yo G3P1011 at 35w6das admitted to the hospital 08/23/2021 for induction of labor.  Indication for induction:  Morbid obesity in pregnancy .  Patient had an uncomplicated labor course as follows: Membrane Rupture Time/Date: 1:29 PM ,08/23/2021   Delivery Method:Vaginal, Spontaneous  Episiotomy: None  Lacerations:  1st degree;Perineal  Details of delivery can be found in separate delivery note.  Patient had a routine postpartum course. Patient is discharged home 08/25/21.  Newborn Data: Birth date:08/23/2021  Birth time:4:45 PM  Gender:Female  Living status:Living  Apgars:8 ,9  Weight:3970 g   Magnesium Sulfate received: No BMZ received: No Rhophylac:No MMR:No T-DaP:Given prenatally Flu: No Transfusion:No  Physical exam  Vitals:   08/24/21 0505 08/24/21 0939 08/24/21 1200 08/25/21 0004  BP: 127/87 126/76 137/76 130/83  Pulse: 86 86 97 92  Resp: 20 20 18 18   Temp: 98.2 F (36.8 C) 98.2 F (36.8 C) 97.8 F (36.6 C) 97.7 F (36.5 C)  TempSrc:  Oral Oral Oral  SpO2: 99% 97% 98% 98%  Weight:      Height:       General: alert and cooperative Lochia: appropriate Uterine Fundus: firm Incision: N/A DVT  Evaluation: No evidence of DVT seen on physical exam. Negative Homan's sign. No cords or calf tenderness. No significant calf/ankle edema.   Labs: Lab Results  Component Value Date   WBC 12.3 (H) 08/23/2021   HGB 12.8 08/23/2021   HCT 38.0 08/23/2021   MCV 80.7 08/23/2021   PLT 179 08/23/2021      Edinburgh Score:    08/24/2021   12:35 PM  Edinburgh Postnatal Depression Scale Screening Tool  I have been able to laugh and see the funny side of things. 0  I have looked forward with enjoyment to things. 0  I have blamed myself unnecessarily when things went wrong. 0  I have been anxious or worried for no good reason. 0  I have felt scared or panicky for no good reason. 0  Things have been getting on top of me. 1  I have been so unhappy that I have had difficulty sleeping. 0  I have felt sad or miserable. 1  I have been so unhappy that I have been crying. 0  The thought of harming myself has occurred to me. 0  Edinburgh Postnatal Depression Scale Total 2     After visit meds:  Allergies as of 08/25/2021   No Known Allergies      Medication List     STOP taking these medications    Butalbital-APAP-Caffeine 50-325-40 MG capsule  TAKE these medications    ibuprofen 600 MG tablet Commonly known as: ADVIL Take 1 tablet (600 mg total) by mouth every 6 (six) hours.   ONE A DAY PRENATAL PO         Discharge home in stable condition Infant Feeding: Breast Infant Disposition:home with mother Discharge instruction: per After Visit Summary and Postpartum booklet. Activity: Advance as tolerated. Pelvic rest for 6 weeks.  Diet: routine diet Anticipated Birth Control: OCPs Postpartum Appointment:6 weeks Additional Postpartum F/U: Incision check 10-14 days Future Appointments: Future Appointments  Date Time Provider [place]  09/03/2021  7:45 AM Harlin Heys, MD [facility] None  09/30/2021  9:45 AM Harlin Heys, MD [facility] None   Follow up  Visit:    08/25/2021 Rubie Maid, MD

## 2021-08-25 NOTE — Progress Notes (Signed)
Patient discharged with infant. Discharge instructions, prescriptions, and follow up appointments given to and reviewed with patient. Patient verbalized understanding. Will be escorted out by staff.  ?

## 2021-08-25 NOTE — Progress Notes (Signed)
Post Partum Day # 2, s/p SVD  Subjective: no complaints, up ad lib, voiding, and tolerating PO. Doing well with breastfeeding. Notes bleeding remains light.   Objective: Vitals:   08/24/21 0505 08/24/21 0939 08/24/21 1200 08/25/21 0004  BP: 127/87 126/76 137/76 130/83  Pulse: 86 86 97 92  Resp: 20 20 18 18   Temp: 98.2 F (36.8 C) 98.2 F (36.8 C) 97.8 F (36.6 C) 97.7 F (36.5 C)  TempSrc:  Oral Oral Oral  SpO2: 99% 97% 98% 98%  Weight:      Height:         Physical Exam:  General: alert and no distress  Lungs: clear to auscultation bilaterally Breasts: normal appearance, no masses or tenderness Heart: regular rate and rhythm, S1, S2 normal, no murmur, click, rub or gallop Abdomen: soft, non-tender; bowel sounds normal; no masses,  no organomegaly Pelvis: Lochia: appropriate, Uterine Fundus: firm Extremities: DVT Evaluation: No evidence of DVT seen on physical exam. Negative Homan's sign. No cords or calf tenderness. No significant calf/ankle edema.  Recent Labs    08/23/21 0146  HGB 12.8  HCT 38.0    Assessment/Plan: Doing well postpartum Breastfeeding, Lactation consult as needed.   Circumcision after discharge with Pediatrician Contraception: OCPs Plan for discharge today.    LOS: 2 days   08/25/21, MD [facility] 08/25/2021 7:36 AM

## 2021-09-03 ENCOUNTER — Telehealth (INDEPENDENT_AMBULATORY_CARE_PROVIDER_SITE_OTHER): Payer: No Typology Code available for payment source | Admitting: Obstetrics and Gynecology

## 2021-09-03 ENCOUNTER — Encounter: Payer: Self-pay | Admitting: Obstetrics and Gynecology

## 2021-09-03 DIAGNOSIS — Z1332 Encounter for screening for maternal depression: Secondary | ICD-10-CM | POA: Diagnosis not present

## 2021-09-03 NOTE — Progress Notes (Signed)
Virtual Visit via Video Note  I connected with Megan Sullivan on 09/03/21 at  7:45 AM EDT by video and verified that I was speaking with the correct person using two identifiers.    [name] is a 36 y.o. G3P1011 who LMP was No LMP recorded. I discussed the limitations, risks, security and privacy concerns of performing an evaluation and management service by video and the availability of in person appointments. I also discussed with the patient that there may be a patient responsible charge related to this service. The patient expressed understanding and agreed to proceed.  Location of patient:  Home  Patient gave explicit verbal consent for video visit:  YES  Location of provider:  [facility] office  Persons other than physician and patient involved in provider conference:  None   Subjective:   History of Present Illness:    Megan Sullivan had a vaginal birth approximately 2 weeks ago.  Almost a 4000 g baby.  She reports she is doing well.  She is breast-feeding.  She says the baby has days and nights mixed up and sometimes cluster feeds at night but she is "working on this".  She is taking prenatal vitamins as previously directed.  At this time she is not interested in any birth control methods.  She will consider this and we discussed that at her 6-week checkup but she does not think she will require birth control. We have discussed any issues with mood changes or postpartum depression.  She says she had half a day where she was somewhat tearful but she was very tired and involved in a naming ceremony and after getting a little rest she felt much better.  She reports that she has had no issues since that time.  Hx: The following portions of the patient's history were reviewed and updated as appropriate:             She  has a past medical history of Asthma, Migraines, and Obesity. She does not have any pertinent problems on file. She  has a past surgical history that includes Ovarian cyst  removal (2017) and Dilation and curettage of uterus (2018). Her family history includes Diabetes in her maternal grandmother; Healthy in her mother; Hyperlipidemia in her father. She  reports that she has never smoked. She has never used smokeless tobacco. She reports that she does not drink alcohol and does not use drugs. She has a current medication list which includes the following prescription(s): ibuprofen and prenatal mv & min w/fa-dha. She has No Known Allergies.       Review of Systems:  Review of Systems  Constitutional: Denied constitutional symptoms, night sweats, recent illness, fatigue, fever, insomnia and weight loss.  Eyes: Denied eye symptoms, eye pain, photophobia, vision change and visual disturbance.  Ears/Nose/Throat/Neck: Denied ear, nose, throat or neck symptoms, hearing loss, nasal discharge, sinus congestion and sore throat.  Cardiovascular: Denied cardiovascular symptoms, arrhythmia, chest pain/pressure, edema, exercise intolerance, orthopnea and palpitations.  Respiratory: Denied pulmonary symptoms, asthma, pleuritic pain, productive sputum, cough, dyspnea and wheezing.  Gastrointestinal: Denied, gastro-esophageal reflux, melena, nausea and vomiting.  Genitourinary: Denied genitourinary symptoms including symptomatic vaginal discharge, pelvic relaxation issues, and urinary complaints.  Musculoskeletal: Denied musculoskeletal symptoms, stiffness, swelling, muscle weakness and myalgia.  Dermatologic: Denied dermatology symptoms, rash and scar.  Neurologic: Denied neurology symptoms, dizziness, headache, neck pain and syncope.  Psychiatric: Denied psychiatric symptoms, anxiety and depression.  Endocrine: Denied endocrine symptoms including hot flashes and night sweats.   Meds:  Current Outpatient Medications on File Prior to Visit  Medication Sig Dispense Refill   ibuprofen (ADVIL) 600 MG tablet Take 1 tablet (600 mg total) by mouth every 6 (six) hours. 60 tablet 0    Prenatal MV & Min w/FA-DHA (ONE A DAY PRENATAL PO)      No current facility-administered medications on file prior to visit.    Assessment:    G3P1011 Patient Active Problem List   Diagnosis Date Noted   Obesity affecting pregnancy in third trimester 08/23/2021   Labor and delivery, indication for care 07/29/2021   Obesity, morbid, BMI 40.0-49.9 (HCC) 06/28/2018   Sickle cell trait (HCC) 01/19/2018     1. Encounter for screening for maternal depression       Plan:            1.  Patient doing well.  No issues with depression.  Seems happy and well adjusted to her new baby.  Breast-feeding full-time. Orders No orders of the defined types were placed in this encounter.   No orders of the defined types were placed in this encounter.     F/U  Return in about 4 weeks (around 10/01/2021). I spent 16 minutes involved in the care of this patient preparing to see the patient by obtaining and reviewing her medical history (including labs, imaging tests and prior procedures), documenting clinical information in the electronic health record (EHR), counseling and coordinating care plans, writing and sending prescriptions, ordering tests or procedures and in direct communicating with the patient and medical staff discussing pertinent items from her history and physical exam.  Elonda Husky, M.D. 09/03/2021 7:51 AM

## 2021-09-29 NOTE — Progress Notes (Unsigned)
   OBSTETRICS POSTPARTUM CLINIC PROGRESS NOTE  Subjective:     Megan Sullivan is a 36 y.o. G38P1011 female who presents for a postpartum visit. She is 6 weeks postpartum following a spontaneous vaginal delivery. I have fully reviewed the prenatal and intrapartum course. The delivery was at 39.4 gestational weeks.  Anesthesia: epidural. Postpartum course has been ***. Baby's course has been ***. Baby is feeding by {breast/bottle:69}. Bleeding: patient {HAS HAS JOI:78676} not resumed menses, with No LMP recorded.. Bowel function is {normal:32111}. Bladder function is {normal:32111}. Patient {is/is not:9024} sexually active. Contraception method desired is {contraceptive method:5051}. Postpartum depression screening: {neg default:13464::"negative"}.  EDPS score is ***.    The following portions of the patient's history were reviewed and updated as appropriate: allergies, current medications, past family history, past medical history, past social history, past surgical history, and problem list.  Review of Systems {ros; complete:30496}   Objective:    There were no vitals taken for this visit.  General:  alert and no distress   Breasts:  inspection negative, no nipple discharge or bleeding, no masses or nodularity palpable  Lungs: clear to auscultation bilaterally  Heart:  regular rate and rhythm, S1, S2 normal, no murmur, click, rub or gallop  Abdomen: soft, non-tender; bowel sounds normal; no masses,  no organomegaly.  ***Well healed Pfannenstiel incision   Vulva:  normal  Vagina: normal vagina, no discharge, exudate, lesion, or erythema  Cervix:  no cervical motion tenderness and no lesions  Corpus: normal size, contour, position, consistency, mobility, non-tender  Adnexa:  normal adnexa and no mass, fullness, tenderness  Rectal Exam: Not performed.         Labs:  Lab Results  Component Value Date   HGB 12.8 08/23/2021     Assessment:   No diagnosis found.   Plan:    1.  Contraception: {method:5051} 2. Will check Hgb for h/o postpartum anemia of less than 10.  3. Follow up in:  4-6  months or as needed.    Hildred Laser, MD [facility]

## 2021-09-30 ENCOUNTER — Encounter: Payer: No Typology Code available for payment source | Admitting: Obstetrics and Gynecology

## 2021-09-30 ENCOUNTER — Encounter: Payer: Self-pay | Admitting: Obstetrics and Gynecology

## 2021-09-30 ENCOUNTER — Ambulatory Visit (INDEPENDENT_AMBULATORY_CARE_PROVIDER_SITE_OTHER): Payer: No Typology Code available for payment source | Admitting: Obstetrics and Gynecology

## 2021-09-30 DIAGNOSIS — Z8669 Personal history of other diseases of the nervous system and sense organs: Secondary | ICD-10-CM

## 2021-09-30 MED ORDER — SUMATRIPTAN SUCCINATE 50 MG PO TABS: 50.0000 mg | ORAL_TABLET | ORAL | 6 refills | Status: AC | PRN

## 2021-11-13 ENCOUNTER — Encounter: Payer: Self-pay | Admitting: Obstetrics and Gynecology

## 2021-11-20 ENCOUNTER — Encounter: Payer: Self-pay | Admitting: Obstetrics and Gynecology

## 2021-11-21 ENCOUNTER — Encounter: Payer: Self-pay | Admitting: Obstetrics and Gynecology

## 2021-11-26 ENCOUNTER — Encounter: Payer: Self-pay | Admitting: Obstetrics and Gynecology

## 2022-07-13 ENCOUNTER — Telehealth: Payer: Self-pay

## 2022-07-13 NOTE — Telephone Encounter (Signed)
Called [employer] about FMLA for her spouse almost a year later. Left voicemail and asked for patient to return my call

## 2022-08-14 ENCOUNTER — Ambulatory Visit: Payer: No Typology Code available for payment source | Admitting: Obstetrics and Gynecology

## 2022-10-15 ENCOUNTER — Ambulatory Visit (INDEPENDENT_AMBULATORY_CARE_PROVIDER_SITE_OTHER): Payer: No Typology Code available for payment source

## 2022-10-15 VITALS — BP 112/75 | HR 83 | Ht 65.0 in | Wt 245.0 lb

## 2022-10-15 DIAGNOSIS — Z3200 Encounter for pregnancy test, result unknown: Secondary | ICD-10-CM

## 2022-10-15 DIAGNOSIS — Z3201 Encounter for pregnancy test, result positive: Secondary | ICD-10-CM | POA: Diagnosis not present

## 2022-10-15 LAB — POCT URINE PREGNANCY: Preg Test, Ur: POSITIVE — AB

## 2022-10-15 NOTE — Progress Notes (Signed)
    NURSE VISIT NOTE  Subjective:    Patient ID: Megan Sullivan, female    DOB: 12-15-1985, 37 y.o.   MRN: [id number]  HPI  Patient is a 37 y.o. G82P1011 female who presents for evaluation of amenorrhea. She believes she could be pregnant.Current symptoms also include: breast tenderness, fatigue, and nausea.     Objective:    BP 112/75   Pulse 83   Ht 5\' 5"  (1.651 m)   Wt 245 lb (111.1 kg)   LMP 08/28/2022 (Exact Date)   Breastfeeding Yes   BMI 40.77 kg/m   Lab Review  Results for orders placed or performed in visit on 10/15/22  POCT urine pregnancy  Result Value Ref Range   Preg Test, Ur Positive (A) Negative    Assessment:   1. Possible pregnancy, not confirmed     Plan:   Pregnancy Test: Positive  Estimated Date of Delivery: None noted. BP Cuff Measurement taken. Cuff Size Adult Large Encouraged well-balanced diet, plenty of rest when needed, pre-natal vitamins daily and walking for exercise.  Discussed self-help for nausea, avoiding OTC medications until consulting provider or pharmacist, other than Tylenol as needed, minimal caffeine (1-2 cups daily) and avoiding alcohol.   She will schedule her nurse visit @ 7-[redacted] wks pregnant, u/s for dating @10  wk, and NOB visit at [redacted] wk pregnant.    Feel free to call with any questions.     Loney Laurence, CMA

## 2022-10-28 ENCOUNTER — Ambulatory Visit: Payer: No Typology Code available for payment source

## 2022-10-28 DIAGNOSIS — Z3490 Encounter for supervision of normal pregnancy, unspecified, unspecified trimester: Secondary | ICD-10-CM | POA: Insufficient documentation

## 2022-10-28 DIAGNOSIS — Z3689 Encounter for other specified antenatal screening: Secondary | ICD-10-CM

## 2022-10-28 DIAGNOSIS — O0990 Supervision of high risk pregnancy, unspecified, unspecified trimester: Secondary | ICD-10-CM | POA: Insufficient documentation

## 2022-10-28 DIAGNOSIS — O99210 Obesity complicating pregnancy, unspecified trimester: Secondary | ICD-10-CM | POA: Insufficient documentation

## 2022-10-28 DIAGNOSIS — O3680X0 Pregnancy with inconclusive fetal viability, not applicable or unspecified: Secondary | ICD-10-CM

## 2022-10-28 DIAGNOSIS — Z3A08 8 weeks gestation of pregnancy: Secondary | ICD-10-CM

## 2022-10-28 DIAGNOSIS — Z3481 Encounter for supervision of other normal pregnancy, first trimester: Secondary | ICD-10-CM

## 2022-10-28 NOTE — Progress Notes (Signed)
New OB Intake  I connected with  Megan Sullivan on 10/28/22 at  1:15 PM EDT by telephone Video Visit and verified that I am speaking with the correct person using two identifiers. Nurse is located at [facility] and pt is located at [place].  I discussed the limitations, risks, security and privacy concerns of performing an evaluation and management service by telephone and the availability of in person appointments. I also discussed with the patient that there may be a patient responsible charge related to this service. The patient expressed understanding and agreed to proceed.  I explained I am completing New OB Intake today. We discussed her EDD of 06/04/23 that is based on LMP of 08/28/22. Pt is G4/P2012. I reviewed her allergies, medications, Medical/Surgical/OB history, and appropriate screenings. There are cats in the home in the home  no. Based on history, this is a/an pregnancy uncomplicated .   Patient Active Problem List   Diagnosis Date Noted   Supervision of low-risk pregnancy 10/28/2022   Obesity affecting pregnancy in third trimester 08/23/2021   Labor and delivery, indication for care 07/29/2021   Obesity, morbid, BMI 40.0-49.9 (HCC) 06/28/2018   Sickle cell trait (HCC) 01/19/2018    Concerns addressed today  Delivery Plans:  Plans to deliver at [facility]  Anatomy US Explained first scheduled [place] will be around 19 weeks. Dating [place] scheduled for 11/06/22 at 11:15. Pt notified to arrive at 11:00.  Labs Discussed Avelina Laine genetic screening with patient. Patient does not want genetic testing to be drawn at new OB visit. Discussed possible labs to be drawn at new OB appointment.  COVID Vaccine Patient has had COVID vaccine.   Social Determinants of Health Food Insecurity: denies food insecurity WIC Referral: Patient is not interested in referral to [facility].  Transportation: Patient denies transportation needs. Childcare: Discussed no children allowed at  ultrasound appointments.   First visit review I reviewed new OB appt with pt. I explained she will have ob bloodwork and pap smear/pelvic exam if indicated. Explained pt will be scheduled after her dating scan.   Deno Etienne Licia Harl, CMA 10/28/2022  1:32 PM

## 2022-10-28 NOTE — Patient Instructions (Signed)
First Trimester of Pregnancy  The first trimester of pregnancy starts on the first day of your last menstrual period until the end of week 12. This is also called months 1 through 3 of pregnancy. Body changes during your first trimester Your body goes through many changes during pregnancy. The changes usually return to normal after your baby is born. Physical changes You may gain or lose weight. Your breasts may grow larger and hurt. The area around your nipples may get darker. Dark spots or blotches may develop on your face. You may have changes in your hair. Health changes You may feel like you might vomit (nauseous), and you may vomit. You may have heartburn. You may have headaches. You may have trouble pooping (constipation). Your gums may bleed. Other changes You may get tired easily. You may pee (urinate) more often. Your menstrual periods will stop. You may not feel hungry. You may want to eat certain kinds of food. You may have changes in your emotions from day to day. You may have more dreams. Follow these instructions at home: Medicines Take over-the-counter and prescription medicines only as told by your doctor. Some medicines are not safe during pregnancy. Take a prenatal vitamin that contains at least 600 micrograms (mcg) of folic acid. Eating and drinking Eat healthy meals that include: Fresh fruits and vegetables. Whole grains. Good sources of protein, such as meat, eggs, or tofu. Low-fat dairy products. Avoid raw meat and unpasteurized juice, milk, and cheese. If you feel like you may vomit, or you vomit: Eat 4 or 5 small meals a day instead of 3 large meals. Try eating a few soda crackers. Drink liquids between meals instead of during meals. You may need to take these actions to prevent or treat trouble pooping: Drink enough fluids to keep your pee (urine) pale yellow. Eat foods that are high in fiber. These include beans, whole grains, and fresh fruits and  vegetables. Limit foods that are high in fat and sugar. These include fried or sweet foods. Activity Exercise only as told by your doctor. Most people can do their usual exercise routine during pregnancy. Stop exercising if you have cramps or pain in your lower belly (abdomen) or low back. Do not exercise if it is too hot or too humid, or if you are in a place of great height (high altitude). Avoid heavy lifting. If you choose to, you may have sex unless your doctor tells you not to. Relieving pain and discomfort Wear a good support bra if your breasts are sore. Rest with your legs raised (elevated) if you have leg cramps or low back pain. If you have bulging veins (varicose veins) in your legs: Wear support hose as told by your doctor. Raise your feet for 15 minutes, 3-4 times a day. Limit salt in your food. Safety Wear your seat belt at all times when you are in a car. Talk with your doctor if someone is hurting you or yelling at you. Talk with your doctor if you are feeling sad or have thoughts of hurting yourself. Lifestyle Do not use hot tubs, steam rooms, or saunas. Do not douche. Do not use tampons or scented sanitary pads. Do not use herbal medicines, illegal drugs, or medicines that are not approved by your doctor. Do not drink alcohol. Do not smoke or use any products that contain nicotine or tobacco. If you need help quitting, ask your doctor. Avoid cat litter boxes and soil that is used by cats. These carry  germs that can cause harm to the baby and can cause a loss of your baby by miscarriage or stillbirth. General instructions Keep all follow-up visits. This is important. Ask for help if you need counseling or if you need help with nutrition. Your doctor can give you advice or tell you where to go for help. Visit your dentist. At home, brush your teeth with a soft toothbrush. Floss gently. Write down your questions. Take them to your prenatal visits. Where to find more  information American Pregnancy Association: americanpregnancy.org [employer]: www.acog.org Office on Women's Health: MightyReward.co.nz Contact a doctor if: You are dizzy. You have a fever. You have mild cramps or pressure in your lower belly. You have a nagging pain in your belly area. You continue to feel like you may vomit, you vomit, or you have watery poop (diarrhea) for 24 hours or longer. You have a bad-smelling fluid coming from your vagina. You have pain when you pee. You are exposed to a disease that spreads from person to person, such as chickenpox, measles, Zika virus, HIV, or hepatitis. Get help right away if: You have spotting or bleeding from your vagina. You have very bad belly cramping or pain. You have shortness of breath or chest pain. You have any kind of injury, such as from a fall or a car crash. You have new or increased pain, swelling, or redness in an arm or leg. Summary The first trimester of pregnancy starts on the first day of your last menstrual period until the end of week 12 (months 1 through 3). Eat 4 or 5 small meals a day instead of 3 large meals. Do not smoke or use any products that contain nicotine or tobacco. If you need help quitting, ask your doctor. Keep all follow-up visits. This information is not intended to replace advice given to you by your health care provider. Make sure you discuss any questions you have with your health care provider. Document Revised: 07/05/2019 Document Reviewed: 05/11/2019 Elsevier Patient Education  2024 Elsevier Inc. Commonly Asked Questions During Pregnancy  Cats: A parasite can be excreted in cat feces.  To avoid exposure you need to have another person empty the little box.  If you must empty the litter box you will need to wear gloves.  Wash your hands after handling your cat.  This parasite can also be found in raw or undercooked meat so this should also be  avoided.  Colds, Sore Throats, Flu: Please check your medication sheet to see what you can take for symptoms.  If your symptoms are unrelieved by these medications please call the office.  Dental Work: Most any dental work Agricultural consultant recommends is permitted.  X-rays should only be taken during the first trimester if absolutely necessary.  Your abdomen should be shielded with a lead apron during all x-rays.  Please notify your provider prior to receiving any x-rays.  Novocaine is fine; gas is not recommended.  If your dentist requires a note from [place] prior to dental work please call the office and we will provide one for you.  Exercise: Exercise is an important part of staying healthy during your pregnancy.  You may continue most exercises you were accustomed to prior to pregnancy.  Later in your pregnancy you will most likely notice you have difficulty with activities requiring balance like riding a bicycle.  It is important that you listen to your body and avoid activities that put you at a higher  risk of falling.  Adequate rest and staying well hydrated are a must!  If you have questions about the safety of specific activities ask your provider.    Exposure to Children with illness: Try to avoid obvious exposure; report any symptoms to [place] when noted,  If you have chicken pos, red measles or mumps, you should be immune to these diseases.   Please do not take any vaccines while pregnant unless you have checked with your OB provider.  Fetal Movement: After 28 weeks we recommend you do "kick counts" twice daily.  Lie or sit down in a calm quiet environment and count your baby movements "kicks".  You should feel your baby at least 10 times per hour.  If you have not felt 10 kicks within the first hour get up, walk around and have something sweet to eat or drink then repeat for an additional hour.  If count remains less than 10 per hour notify your provider.  Fumigating: Follow your pest control agent's  advice as to how long to stay out of your home.  Ventilate the area well before re-entering.  Hemorrhoids:   Most over-the-counter preparations can be used during pregnancy.  Check your medication to see what is safe to use.  It is important to use a stool softener or fiber in your diet and to drink lots of liquids.  If hemorrhoids seem to be getting worse please call the office.   Hot Tubs:  Hot tubs Jacuzzis and saunas are not recommended while pregnant.  These increase your internal body temperature and should be avoided.  Intercourse:  Sexual intercourse is safe during pregnancy as long as you are comfortable, unless otherwise advised by your provider.  Spotting may occur after intercourse; report any bright red bleeding that is heavier than spotting.  Labor:  If you know that you are in labor, please go to the hospital.  If you are unsure, please call the office and let us help you decide what to do.  Lifting, straining, etc:  If your job requires heavy lifting or straining please check with your provider for any limitations.  Generally, you should not lift items heavier than that you can lift simply with your hands and arms (no back muscles)  Painting:  Paint fumes do not harm your pregnancy, but may make you ill and should be avoided if possible.  Latex or water based paints have less odor than oils.  Use adequate ventilation while painting.  Permanents & Hair Color:  Chemicals in hair dyes are not recommended as they cause increase hair dryness which can increase hair loss during pregnancy.  " Highlighting" and permanents are allowed.  Dye may be absorbed differently and permanents may not hold as well during pregnancy.  Sunbathing:  Use a sunscreen, as skin burns easily during pregnancy.  Drink plenty of fluids; avoid over heating.  Tanning Beds:  Because their possible side effects are still unknown, tanning beds are not recommended.  Ultrasound Scans:  Routine ultrasounds are performed  at approximately 20 weeks.  You will be able to see your baby's general anatomy an if you would like to know the gender this can usually be determined as well.  If it is questionable when you conceived you may also receive an ultrasound early in your pregnancy for dating purposes.  Otherwise ultrasound exams are not routinely performed unless there is a medical necessity.  Although you can request a scan we ask that you pay for it when  conducted because insurance does not cover " patient request" scans.  Work: If your pregnancy proceeds without complications you may work until your due date, unless your physician or employer advises otherwise.  Round Ligament Pain/Pelvic Discomfort:  Sharp, shooting pains not associated with bleeding are fairly common, usually occurring in the second trimester of pregnancy.  They tend to be worse when standing up or when you remain standing for long periods of time.  These are the result of pressure of certain pelvic ligaments called "round ligaments".  Rest, Tylenol and heat seem to be the most effective relief.  As the womb and fetus grow, they rise out of the pelvis and the discomfort improves.  Please notify the office if your pain seems different than that described.  It may represent a more serious condition.  Common Medications Safe in Pregnancy  Acne:      Constipation:  Benzoyl Peroxide     Colace  Clindamycin      Dulcolax Suppository  Topica Erythromycin     Fibercon  Salicylic Acid      Metamucil         Miralax AVOID:        Senakot   Accutane    Cough:  Retin-A       Cough Drops  Tetracycline      Phenergan w/ Codeine if Rx  Minocycline      Robitussin (Plain & DM)  Antibiotics:     Crabs/Lice:  Ceclor       RID  Cephalosporins    AVOID:  E-Mycins      Kwell  Keflex  Macrobid/Macrodantin   Diarrhea:  Penicillin      Kao-Pectate  Zithromax      Imodium AD         PUSH FLUIDS AVOID:       Cipro     Fever:  Tetracycline      Tylenol (Regular  or Extra  Minocycline       Strength)  Levaquin      Extra Strength-Do not          Exceed 8 tabs/24 hrs Caffeine:        200mg /day (equiv. To 1 cup of coffee or  approx. 3 12 oz sodas)         Gas: Cold/Hayfever:       Gas-X  Benadryl      Mylicon  Claritin       Phazyme  **Claritin-D        Chlor-Trimeton    Headaches:  Dimetapp      ASA-Free Excedrin  Drixoral-Non-Drowsy     Cold Compress  Mucinex (Guaifenasin)     Tylenol (Regular or Extra  Sudafed/Sudafed-12 Hour     Strength)  **Sudafed PE Pseudoephedrine   Tylenol Cold & Sinus     Vicks Vapor Rub  Zyrtec  **AVOID if Problems With Blood Pressure         Heartburn: Avoid lying down for at least 1 hour after meals  Aciphex      Maalox     Rash:  Milk of Magnesia     Benadryl    Mylanta       1% Hydrocortisone Cream  Pepcid  Pepcid Complete   Sleep Aids:  Prevacid      Ambien   Prilosec       Benadryl  Rolaids       Chamomile Tea  Tums (Limit 4/day)     Unisom  Tylenol PM         Warm milk-add vanilla or  Hemorrhoids:       Sugar for taste  Anusol/Anusol H.C.  (RX: Analapram 2.5%)  Sugar Substitutes:  Hydrocortisone OTC     Ok in moderation  Preparation H      Tucks        Vaseline lotion applied to tissue with wiping    Herpes:     Throat:  Acyclovir      Oragel  Famvir  Valtrex     Vaccines:         Flu Shot Leg Cramps:       *Gardasil  Benadryl      Hepatitis A         Hepatitis B Nasal Spray:       Pneumovax  Saline Nasal Spray     Polio Booster         Tetanus Nausea:       Tuberculosis test or PPD  Vitamin B6 25 mg TID   AVOID:    Dramamine      *Gardasil  Emetrol       Live Poliovirus  Ginger Root 250 mg QID    MMR (measles, mumps &  High Complex Carbs @ Bedtime    rebella)  Sea Bands-Accupressure    Varicella (Chickenpox)  Unisom 1/2 tab TID     *No known complications           If received before Pain:         Known pregnancy;   Darvocet       Resume series  after  Lortab        Delivery  Percocet    Yeast:   Tramadol      Femstat  Tylenol 3      Gyne-lotrimin  Ultram       Monistat  Vicodin           MISC:         All Sunscreens           Hair Coloring/highlights          Insect Repellant's          (Including DEET)         Mystic Tans

## 2022-10-29 ENCOUNTER — Encounter: Payer: Self-pay | Admitting: Obstetrics and Gynecology

## 2022-11-06 ENCOUNTER — Telehealth: Payer: Self-pay | Admitting: Obstetrics and Gynecology

## 2022-11-06 ENCOUNTER — Other Ambulatory Visit: Payer: No Typology Code available for payment source

## 2022-11-06 NOTE — Telephone Encounter (Signed)
I contacted the patient via phone x2. The is no ultrasound tech today so we are needing to rescheduled. I left voicemail for the patient to be aware of this as well as asking the patient to call back for information on new visit.  [facility] Report to registration  Tuesday Nov 10, 2022 4:30 PM Arrive with a full bladder - drink 32 oz of water 30 mins prior to appointment and do not void. [facility] [address] [place] [address] [phone]

## 2022-11-06 NOTE — Telephone Encounter (Signed)
The patient contacted the office. The patient confirmed the new location, date and time. I did provider centralized scheduling contact number due to the patient would like spouse to attend.

## 2022-11-10 ENCOUNTER — Ambulatory Visit: Payer: No Typology Code available for payment source

## 2022-11-12 ENCOUNTER — Other Ambulatory Visit: Payer: No Typology Code available for payment source

## 2022-11-12 DIAGNOSIS — O3680X0 Pregnancy with inconclusive fetal viability, not applicable or unspecified: Secondary | ICD-10-CM

## 2022-11-12 DIAGNOSIS — Z3481 Encounter for supervision of other normal pregnancy, first trimester: Secondary | ICD-10-CM

## 2022-11-12 DIAGNOSIS — Z3A10 10 weeks gestation of pregnancy: Secondary | ICD-10-CM

## 2022-11-12 DIAGNOSIS — Z3490 Encounter for supervision of normal pregnancy, unspecified, unspecified trimester: Secondary | ICD-10-CM

## 2022-11-12 DIAGNOSIS — Z3687 Encounter for antenatal screening for uncertain dates: Secondary | ICD-10-CM

## 2022-11-26 ENCOUNTER — Ambulatory Visit (INDEPENDENT_AMBULATORY_CARE_PROVIDER_SITE_OTHER): Payer: No Typology Code available for payment source | Admitting: Obstetrics and Gynecology

## 2022-11-26 ENCOUNTER — Encounter: Payer: Self-pay | Admitting: Obstetrics and Gynecology

## 2022-11-26 VITALS — BP 126/83 | HR 87 | Wt 251.0 lb

## 2022-11-26 DIAGNOSIS — O99210 Obesity complicating pregnancy, unspecified trimester: Secondary | ICD-10-CM

## 2022-11-26 DIAGNOSIS — Z23 Encounter for immunization: Secondary | ICD-10-CM

## 2022-11-26 DIAGNOSIS — Z3482 Encounter for supervision of other normal pregnancy, second trimester: Secondary | ICD-10-CM | POA: Diagnosis not present

## 2022-11-26 DIAGNOSIS — Z113 Encounter for screening for infections with a predominantly sexual mode of transmission: Secondary | ICD-10-CM

## 2022-11-26 DIAGNOSIS — Z130 Encounter for screening for diseases of the blood and blood-forming organs and certain disorders involving the immune mechanism: Secondary | ICD-10-CM

## 2022-11-26 DIAGNOSIS — Z3A13 13 weeks gestation of pregnancy: Secondary | ICD-10-CM

## 2022-11-26 DIAGNOSIS — G43909 Migraine, unspecified, not intractable, without status migrainosus: Secondary | ICD-10-CM

## 2022-11-26 MED ORDER — BUTALBITAL-APAP-CAFFEINE 50-325-40 MG PO CAPS
1.0000 | ORAL_CAPSULE | Freq: Four times a day (QID) | ORAL | 1 refills | Status: DC | PRN
Start: 2022-11-26 — End: 2023-06-17

## 2022-11-26 NOTE — Addendum Note (Signed)
Addended by: Elonda Husky on: 11/26/2022 09:15 AM   Modules accepted: Orders

## 2022-11-26 NOTE — Progress Notes (Signed)
Patient presents today for New OB physical. She states wanting to discuss Fioricet, history of migraines and has used this in previous pregnancies. Up to date for her pap smear. Patient declines genetic testing, Materniti21 not ordered. She states no other questions or concerns at this time.

## 2022-11-26 NOTE — Progress Notes (Signed)
NOB: She reports that she is doing well.  She has not had as much nausea and vomiting during this pregnancy.  She has a history of migraines and typically takes sumatriptan.  She does not take this during pregnancy.  She has used Fioricet before in a sparing manner and would like a prescription.  Patient to complete blood work today.  Fetal heart tones not heard today -plan follow-up in 1 week for fetal heart tones only.  Physical examination General NAD, Conversant  HEENT Atraumatic; Op clear with mmm.  Normo-cephalic. Pupils reactive. Anicteric sclerae  Thyroid/Neck Smooth without nodularity or enlargement. Normal ROM.  Neck Supple.  Skin No rashes, lesions or ulceration. Normal palpated skin turgor. No nodularity.  Breasts: No masses or discharge.  Symmetric.  No axillary adenopathy.  Lungs: Clear to auscultation.No rales or wheezes. Normal Respiratory effort, no retractions.  Heart: NSR.  No murmurs or rubs appreciated. No peripheral edema  Abdomen: Soft.  Non-tender.  No masses.  No HSM. No hernia  Extremities: Moves all appropriately.  Normal ROM for age. No lymphadenopathy.  Neuro: Oriented to PPT.  Normal mood. Normal affect.     Pelvic: Deferred

## 2022-11-27 LAB — URINALYSIS, ROUTINE W REFLEX MICROSCOPIC
Bilirubin, UA: NEGATIVE
Glucose, UA: NEGATIVE
Ketones, UA: NEGATIVE
Nitrite, UA: NEGATIVE
Protein,UA: NEGATIVE
RBC, UA: NEGATIVE
Specific Gravity, UA: 1.015 (ref 1.005–1.030)
Urobilinogen, Ur: 0.2 mg/dL (ref 0.2–1.0)
pH, UA: 6.5 (ref 5.0–7.5)

## 2022-11-27 LAB — MICROSCOPIC EXAMINATION
Bacteria, UA: NONE SEEN
Casts: NONE SEEN /[LPF]
RBC, Urine: NONE SEEN /[HPF] (ref 0–2)
WBC, UA: NONE SEEN /[HPF] (ref 0–5)

## 2022-11-28 LAB — MONITOR DRUG PROFILE 14(MW)
Amphetamine Scrn, Ur: NEGATIVE ng/mL
BARBITURATE SCREEN URINE: NEGATIVE ng/mL
BENZODIAZEPINE SCREEN, URINE: NEGATIVE ng/mL
Buprenorphine, Urine: NEGATIVE ng/mL
CANNABINOIDS UR QL SCN: NEGATIVE ng/mL
Cocaine (Metab) Scrn, Ur: NEGATIVE ng/mL
Creatinine(Crt), U: 85.8 mg/dL (ref 20.0–300.0)
Fentanyl, Urine: NEGATIVE pg/mL
Meperidine Screen, Urine: NEGATIVE ng/mL
Methadone Screen, Urine: NEGATIVE ng/mL
OXYCODONE+OXYMORPHONE UR QL SCN: NEGATIVE ng/mL
Opiate Scrn, Ur: NEGATIVE ng/mL
Ph of Urine: 6.3 (ref 4.5–8.9)
Phencyclidine Qn, Ur: NEGATIVE ng/mL
Propoxyphene Scrn, Ur: NEGATIVE ng/mL
SPECIFIC GRAVITY: 1.015
Tramadol Screen, Urine: NEGATIVE ng/mL

## 2022-11-28 LAB — CULTURE, OB URINE

## 2022-11-28 LAB — NICOTINE SCREEN, URINE: Cotinine Ql Scrn, Ur: NEGATIVE ng/mL

## 2022-11-28 LAB — URINE CULTURE, OB REFLEX

## 2022-11-29 LAB — GC/CHLAMYDIA PROBE AMP
Chlamydia trachomatis, NAA: NEGATIVE
Neisseria Gonorrhoeae by PCR: NEGATIVE

## 2022-11-30 LAB — CBC/D/PLT+RPR+RH+ABO+RUBIGG...
Antibody Screen: NEGATIVE
Basophils Absolute: 0 10*3/uL (ref 0.0–0.2)
Basos: 0 %
EOS (ABSOLUTE): 0.2 10*3/uL (ref 0.0–0.4)
Eos: 2 %
HCV Ab: NONREACTIVE
HIV Screen 4th Generation wRfx: NONREACTIVE
Hematocrit: 39.5 % (ref 34.0–46.6)
Hemoglobin: 12.8 g/dL (ref 11.1–15.9)
Hepatitis B Surface Ag: NEGATIVE
Immature Grans (Abs): 0 10*3/uL (ref 0.0–0.1)
Immature Granulocytes: 0 %
Lymphocytes Absolute: 1.5 10*3/uL (ref 0.7–3.1)
Lymphs: 19 %
MCH: 28.3 pg (ref 26.6–33.0)
MCHC: 32.4 g/dL (ref 31.5–35.7)
MCV: 87 fL (ref 79–97)
Monocytes Absolute: 0.5 10*3/uL (ref 0.1–0.9)
Monocytes: 6 %
Neutrophils Absolute: 5.8 10*3/uL (ref 1.4–7.0)
Neutrophils: 73 %
Platelets: 180 10*3/uL (ref 150–450)
RBC: 4.53 x10E6/uL (ref 3.77–5.28)
RDW: 17.7 % — ABNORMAL HIGH (ref 11.7–15.4)
RPR Ser Ql: NONREACTIVE
Rh Factor: POSITIVE
Rubella Antibodies, IGG: 2.64 {index} (ref >0.99)
Varicella zoster IgG: REACTIVE
WBC: 8 10*3/uL (ref 3.4–10.8)

## 2022-11-30 LAB — HEMOGLOBIN A1C
Est. average glucose Bld gHb Est-mCnc: 105 mg/dL
Hgb A1c MFr Bld: 5.3 % (ref 4.8–5.6)

## 2022-11-30 LAB — HGB FRACTIONATION CASCADE
Hgb A2: 3.5 % — ABNORMAL HIGH (ref 1.8–3.2)
Hgb A: 58.5 % — ABNORMAL LOW (ref 96.4–98.8)
Hgb F: 0 % (ref 0.0–2.0)
Hgb S: 38 % — ABNORMAL HIGH

## 2022-11-30 LAB — HGB SOLUBILITY: Hgb Solubility: POSITIVE — AB

## 2022-11-30 LAB — HCV INTERPRETATION

## 2022-12-03 ENCOUNTER — Ambulatory Visit (INDEPENDENT_AMBULATORY_CARE_PROVIDER_SITE_OTHER): Payer: No Typology Code available for payment source | Admitting: Obstetrics and Gynecology

## 2022-12-03 ENCOUNTER — Encounter: Payer: Self-pay | Admitting: Obstetrics and Gynecology

## 2022-12-03 VITALS — BP 132/84 | HR 94 | Wt 260.4 lb

## 2022-12-03 DIAGNOSIS — Z3482 Encounter for supervision of other normal pregnancy, second trimester: Secondary | ICD-10-CM

## 2022-12-03 DIAGNOSIS — Z3492 Encounter for supervision of normal pregnancy, unspecified, second trimester: Secondary | ICD-10-CM

## 2022-12-03 DIAGNOSIS — Z3A14 14 weeks gestation of pregnancy: Secondary | ICD-10-CM

## 2022-12-03 NOTE — Progress Notes (Signed)
ROB. Patient presents today for fetal heart tones. Additionally she stated using one dose of Fioricet and having relief, very grateful for this. She denies any pain, cramping or bleeding.

## 2022-12-03 NOTE — Progress Notes (Signed)
ROB: Presents today because we could not find heart tones at her last visit.  Fetal heart tones heard today 164.  She reports no other problems at time.

## 2022-12-31 ENCOUNTER — Encounter: Payer: No Typology Code available for payment source | Admitting: Obstetrics

## 2022-12-31 ENCOUNTER — Ambulatory Visit (INDEPENDENT_AMBULATORY_CARE_PROVIDER_SITE_OTHER): Payer: No Typology Code available for payment source | Admitting: Certified Nurse Midwife

## 2022-12-31 VITALS — BP 125/85 | HR 85 | Wt 262.1 lb

## 2022-12-31 DIAGNOSIS — Z3A18 18 weeks gestation of pregnancy: Secondary | ICD-10-CM

## 2022-12-31 LAB — POCT URINALYSIS DIPSTICK OB
Bilirubin, UA: NEGATIVE
Blood, UA: NEGATIVE
Glucose, UA: NEGATIVE
Ketones, UA: NEGATIVE
Leukocytes, UA: NEGATIVE
Nitrite, UA: NEGATIVE
POC,PROTEIN,UA: NEGATIVE
Spec Grav, UA: 1.015 (ref 1.010–1.025)
Urobilinogen, UA: 0.2 U/dL
pH, UA: 5 (ref 5.0–8.0)

## 2022-12-31 NOTE — Progress Notes (Signed)
ROB doing well, feeling good movement. C/o migraine headaches not responding well to fiorcet . Recommend addition of magnesium .  Discussed u/s for anatomy at 20 weeks. She verbalizes understanding and agrees.   Follow up 2 weeks for u/s and ROB.   Doreene Burke, CNM

## 2022-12-31 NOTE — Patient Instructions (Signed)
Round Ligament Pain  The round ligaments are a pair of cord-like tissues that help support the uterus. They can become a source of pain during pregnancy as the ligaments soften and stretch as the baby grows. The pain usually begins in the second trimester (13-28 weeks) of pregnancy, and should only last for a few seconds when it occurs. However, the pain can come and go until the baby is delivered. The pain does not cause harm to the baby. Round ligament pain is usually a short, sharp, and pinching pain, but it can also be a dull, lingering, and aching pain. The pain is felt in the lower side of the abdomen or in the groin. It usually starts deep in the groin and moves up to the outside of the hip area. The pain may happen when you: Suddenly change position, such as quickly going from a sitting to standing position. Do physical activity. Cough or sneeze. Follow these instructions at home: Managing pain  When the pain starts, relax. Then, try any of these methods to help with the pain: Sit down. Flex your knees up to your abdomen. Lie on your side with one pillow under your abdomen and another pillow between your legs. Sit in a warm bath for 15-20 minutes or until the pain goes away. General instructions Watch your condition for any changes. Move slowly when you sit down or stand up. Stop or reduce your physical activities if they cause pain. Avoid long walks if they cause pain. Take over-the-counter and prescription medicines only as told by your health care provider. Keep all follow-up visits. This is important. Contact a health care provider if: Your pain does not go away with treatment. You feel pain in your back that you did not have before. Your medicine is not helping. You have a fever or chills. You have nausea or vomiting. You have diarrhea. You have pain when you urinate. Get help right away if: You have pain that is a rhythmic, cramping pain similar to labor pains. Labor  pains are usually 2 minutes apart, last for about 1 minute, and involve a bearing down feeling or pressure in your pelvis. You have vaginal bleeding. These symptoms may represent a serious problem that is an emergency. Do not wait to see if the symptoms will go away. Get medical help right away. Call your local emergency services (911 in the [place] Do not drive yourself to the hospital. Summary Round ligament pain is felt in the lower abdomen or groin. This pain usually begins in the second trimester (13-28 weeks) and should only last for a few seconds when it occurs. You may notice the pain when you suddenly change position, when you cough or sneeze, or during physical activity. Relaxing, flexing your knees to your abdomen, lying on one side, or taking a warm bath may help to get rid of the pain. Contact your health care provider if the pain does not go away. This information is not intended to replace advice given to you by your health care provider. Make sure you discuss any questions you have with your health care provider. Document Revised: 04/10/2020 Document Reviewed: 04/10/2020 Elsevier Patient Education  2024 Elsevier Inc.  

## 2023-01-15 ENCOUNTER — Other Ambulatory Visit: Payer: No Typology Code available for payment source

## 2023-01-21 ENCOUNTER — Ambulatory Visit: Payer: No Typology Code available for payment source

## 2023-01-21 ENCOUNTER — Other Ambulatory Visit: Payer: Self-pay | Admitting: Certified Nurse Midwife

## 2023-01-21 DIAGNOSIS — Z363 Encounter for antenatal screening for malformations: Secondary | ICD-10-CM

## 2023-01-21 DIAGNOSIS — Z3A20 20 weeks gestation of pregnancy: Secondary | ICD-10-CM | POA: Diagnosis not present

## 2023-01-28 ENCOUNTER — Encounter: Payer: Self-pay | Admitting: Licensed Practical Nurse

## 2023-01-28 ENCOUNTER — Ambulatory Visit (INDEPENDENT_AMBULATORY_CARE_PROVIDER_SITE_OTHER): Payer: No Typology Code available for payment source | Admitting: Licensed Practical Nurse

## 2023-01-28 VITALS — BP 134/79 | HR 97 | Wt 269.7 lb

## 2023-01-28 DIAGNOSIS — Z3A22 22 weeks gestation of pregnancy: Secondary | ICD-10-CM

## 2023-01-28 DIAGNOSIS — Z3482 Encounter for supervision of other normal pregnancy, second trimester: Secondary | ICD-10-CM

## 2023-01-28 NOTE — Progress Notes (Signed)
Routine Prenatal Care Visit  Subjective  Megan Sullivan is a 37 y.o. [id number] at [redacted]w[redacted]d being seen today for ongoing prenatal care.  She is currently monitored for the following issues for this low-risk pregnancy and has Sickle cell trait (HCC); Obesity, morbid, BMI 40.0-49.9 (HCC); and Supervision of low-risk pregnancy on their problem list.  ----------------------------------------------------------------------------------- Patient reports  carpul tunnel .   -Magnesium is helping with headaches  -feeling good -does not plan to travel -had anatomy [place], needs repeat for insufficient views   Contractions: Not present. Vag. Bleeding: None.  Movement: Present. Leaking Fluid denies.  ----------------------------------------------------------------------------------- The following portions of the patient's history were reviewed and updated as appropriate: allergies, current medications, past family history, past medical history, past social history, past surgical history and problem list. Problem list updated.  Objective  Blood pressure 134/79, pulse 97, weight 269 lb 11.2 oz (122.3 kg), last menstrual period 08/28/2022, currently breastfeeding. Pregravid weight 238 lb (108 kg) Total Weight Gain 31 lb 11.2 oz (14.4 kg) Urinalysis: Urine Protein    Urine Glucose    Fetal Status: Fetal Heart Rate (bpm): 155 Fundal Height: 21 cm Movement: Present     General:  Alert, oriented and cooperative. Patient is in no acute distress.  Skin: Skin is warm and dry. No rash noted.   Cardiovascular: Normal heart rate noted  Respiratory: Normal respiratory effort, no problems with respiration noted  Abdomen: Soft, gravid, appropriate for gestational age. Pain/Pressure: Absent     Pelvic:  Cervical exam deferred        Extremities: Normal range of motion.  Edema: None  Mental Status: Normal mood and affect. Normal behavior. Normal judgment and thought content.   Assessment   37 y.o. [name] at [redacted]w[redacted]d by   06/01/2023, Date entered prior to episode creation presenting for routine prenatal visit  Plan   G4 Problems (from 10/28/22 to present)     Problem Noted Diagnosed Resolved   Supervision of low-risk pregnancy 10/28/2022 by Loman Chroman, CMA  No   Overview Addendum 01/28/2023  8:16 AM by Ellwood Sayers, CNM   Clinical Staff Provider  Office Location  [facility] Dating  06/01/2023, Date entered prior to episode creation  Language  English Anatomy US    Flu Vaccine   Genetic Screen  NIPS:   TDaP vaccine    Hgb A1C or  GTT Early : Third trimester :   Covid yes   LAB RESULTS   Rhogam  O/Positive/-- (10/17 7829)  Blood Type O/Positive/-- (10/17 5621)   RSV  Antibody Negative (10/17 0907)  Feeding Plan breast Rubella 2.64 (10/17 0907)  Contraception  RPR Non Reactive (10/17 0907)   Circumcision  HBsAg Negative (10/17 0907)   Pediatrician  [facility] HIV Non Reactive (10/17 3086)  Support Person husband Varicella Reactive (10/17 0907)  Prenatal Classes N/a GBS  (For PCN allergy, check sensitivities)     Hep C Non Reactive (10/17 0907)   BTL Consent  Pap Diagnosis  Date Value Ref Range Status  12/31/2020   Final   - Negative for intraepithelial lesion or malignancy (NILM)    VBAC Consent  Hgb Electro      CF      SMA                    Preterm labor symptoms and general obstetric precautions including but not limited to vaginal bleeding, contractions, leaking of fluid and fetal movement were reviewed in detail with the patient.  Please refer to After Visit Summary for other counseling recommendations.   Return in about 4 weeks (around 02/25/2023) for ROB.  Repeat US ordered   Carie Caddy, Ina Homes   [facility]  01/28/23  11:55 AM

## 2023-02-10 NOTE — L&D Delivery Note (Signed)
 Obstetrical Delivery Note   Date of Delivery:   06/03/2023 Primary OB:   AOB Gestational Age/EDD: [redacted]w[redacted]d (Dated by LMP) Reason for Admission: IOL secondary to BMI Antepartum complications: GBS positive, gestational thrombocytopenia, sickle cell trait   Delivered By:   Kennth Peal, CNM   Delivery Type:   spontaneous vaginal delivery  Delivery Details:   Megan Sullivan was admitted for IOL secondary to high BMI, on admission her cervix was 1/50/ballotable. She received 3 doses of Cytotec . She received a labor epidural. At 1426 a FSE was placed for variables, her VE was 8/80/0. (Of note amniotic fluid was not noted at time of FSE placement, soon after there was a trickle of fluid during foley catheter placement) She was found to be complete and +2 at 1556. She began to pushing then. Over the next few contractions she effortlessly breathed the baby  out for a SVB of a viable female at 29. Infant birthed OA to ROA, followed by easy and quick shoulders, nuchal cord x3 noted not able to reduce as infant was birthing quickly. Cord unwound from around neck and infant placed on mother's abdomen for stimulation. HR >100, vigorous cry with stimulation. Cord doubly clamped and cut by Dad once pulsation ceased. Placenta delivered with maternal effort. On inspection of perineum a few abrasions noted but hemostatic. Infant skin to skin with mom. Both stable.  Anesthesia:    epidural Intrapartum complications: None GBS:    Positive PCN x 2 Laceration:    none Episiotomy:    none Rectal exam:   Deferred  Placenta:    Delivered and expressed via active management. Intact: yes. To pathology: no.  Delayed Cord Clamping: yes Estimated Blood Loss:  400  Baby:    Liveborn female, APGARs 8/9, weight pending gm  Megan Sullivan, [place]  [facility]  06/03/2023 4:36 PM

## 2023-02-22 ENCOUNTER — Other Ambulatory Visit: Payer: Self-pay | Admitting: Licensed Practical Nurse

## 2023-02-22 DIAGNOSIS — Z3482 Encounter for supervision of other normal pregnancy, second trimester: Secondary | ICD-10-CM

## 2023-02-22 DIAGNOSIS — Z362 Encounter for other antenatal screening follow-up: Secondary | ICD-10-CM

## 2023-02-22 DIAGNOSIS — Z3A22 22 weeks gestation of pregnancy: Secondary | ICD-10-CM

## 2023-02-25 ENCOUNTER — Ambulatory Visit (INDEPENDENT_AMBULATORY_CARE_PROVIDER_SITE_OTHER): Payer: No Typology Code available for payment source

## 2023-02-25 VITALS — BP 127/85 | HR 103 | Wt 275.0 lb

## 2023-02-25 DIAGNOSIS — Z113 Encounter for screening for infections with a predominantly sexual mode of transmission: Secondary | ICD-10-CM

## 2023-02-25 DIAGNOSIS — Z3490 Encounter for supervision of normal pregnancy, unspecified, unspecified trimester: Secondary | ICD-10-CM

## 2023-02-25 DIAGNOSIS — R7309 Other abnormal glucose: Secondary | ICD-10-CM

## 2023-02-25 DIAGNOSIS — Z3A37 37 weeks gestation of pregnancy: Secondary | ICD-10-CM

## 2023-02-25 DIAGNOSIS — Z131 Encounter for screening for diabetes mellitus: Secondary | ICD-10-CM

## 2023-02-25 DIAGNOSIS — D573 Sickle-cell trait: Secondary | ICD-10-CM

## 2023-02-25 DIAGNOSIS — D649 Anemia, unspecified: Secondary | ICD-10-CM

## 2023-02-25 NOTE — Assessment & Plan Note (Signed)
-   BMI now 45.67, will need to have anesthesia consult ordered at next visit.

## 2023-02-25 NOTE — Progress Notes (Signed)
    Return Prenatal Note   Assessment/Plan   Plan  38 y.o. [id number] at [redacted]w[redacted]d presents for follow-up OB visit. Reviewed prenatal record including previous visit note.  Supervision of low-risk pregnancy - Follow up US for completion of anatomy on 1/17. - Prepared for 1 hour glucola, third trimester labs, and Tdap at next visit.  - Discussed itching and low suspicion for ICP given timing in pregnancy and the location of the itching and her history of eczema. Offered to collect ICP labs which she declines at this time. Recommended trying Benadryl or Zyrtec for relief of itching.  - Reviewed red flag warning signs anticipatory guidance for upcoming prenatal care.    Obesity, morbid, BMI 40.0-49.9 (HCC) - BMI now 45.67, will need to have anesthesia consult ordered at next visit.    No orders of the defined types were placed in this encounter.  Return in about 2 weeks (around 03/11/2023) for ROB with 1 hour glucola.   Future Appointments  Date Time Provider Department Center  02/26/2023  4:00 PM AOB-AOB [place] 1 AOB-IMG None  03/11/2023  9:20 AM AOB-OBGYN LAB AOB-AOB None  03/11/2023 10:35 AM Julieanne Manson, MD AOB-AOB None    For next visit:  continue with routine prenatal care     Subjective   38 y.o. A5W0981 at [redacted]w[redacted]d presents for this follow-up prenatal visit.  Patient has concerns about increased itching on legs and around breasts. She thinks it may be related to dry skin, similar to eczema issues she's had in the past. She did google the symptoms and has questions about ICP. Patient reports: Movement: Present Contractions: Not present  Objective   Flow sheet Vitals: Pulse Rate: (!) 103 BP: 127/85 Fundal Height: 27 cm Fetal Heart Rate (bpm): 145 Total weight gain: 37 lb (16.8 kg)  General Appearance  No acute distress, well appearing, and well nourished Pulmonary   Normal work of breathing Neurologic   Alert and oriented to person, place, and time Psychiatric   Mood and  affect within normal limits  Lindalou Hose Jhony Antrim, CNM  01/16/253:22 PM

## 2023-02-25 NOTE — Addendum Note (Signed)
Addended by: Loney Laurence on: 02/25/2023 03:39 PM   Modules accepted: Orders

## 2023-02-25 NOTE — Assessment & Plan Note (Signed)
-   Follow up US for completion of anatomy on 1/17. - Prepared for 1 hour glucola, third trimester labs, and Tdap at next visit.  - Discussed itching and low suspicion for ICP given timing in pregnancy and the location of the itching and her history of eczema. Offered to collect ICP labs which she declines at this time. Recommended trying Benadryl or Zyrtec for relief of itching.  - Reviewed red flag warning signs anticipatory guidance for upcoming prenatal care.

## 2023-02-26 ENCOUNTER — Other Ambulatory Visit: Payer: No Typology Code available for payment source

## 2023-02-26 ENCOUNTER — Ambulatory Visit (INDEPENDENT_AMBULATORY_CARE_PROVIDER_SITE_OTHER): Payer: No Typology Code available for payment source

## 2023-02-26 DIAGNOSIS — Z3A26 26 weeks gestation of pregnancy: Secondary | ICD-10-CM | POA: Diagnosis not present

## 2023-02-26 DIAGNOSIS — Z362 Encounter for other antenatal screening follow-up: Secondary | ICD-10-CM

## 2023-03-11 ENCOUNTER — Ambulatory Visit: Payer: No Typology Code available for payment source | Admitting: Obstetrics

## 2023-03-11 ENCOUNTER — Other Ambulatory Visit: Payer: No Typology Code available for payment source

## 2023-03-11 ENCOUNTER — Encounter: Payer: Self-pay | Admitting: Obstetrics

## 2023-03-11 VITALS — BP 124/77 | HR 95 | Wt 275.0 lb

## 2023-03-11 DIAGNOSIS — Z23 Encounter for immunization: Secondary | ICD-10-CM

## 2023-03-11 DIAGNOSIS — Z3490 Encounter for supervision of normal pregnancy, unspecified, unspecified trimester: Secondary | ICD-10-CM

## 2023-03-11 DIAGNOSIS — E669 Obesity, unspecified: Secondary | ICD-10-CM

## 2023-03-11 DIAGNOSIS — D649 Anemia, unspecified: Secondary | ICD-10-CM

## 2023-03-11 DIAGNOSIS — Z131 Encounter for screening for diabetes mellitus: Secondary | ICD-10-CM

## 2023-03-11 DIAGNOSIS — O99210 Obesity complicating pregnancy, unspecified trimester: Secondary | ICD-10-CM

## 2023-03-11 DIAGNOSIS — Z113 Encounter for screening for infections with a predominantly sexual mode of transmission: Secondary | ICD-10-CM

## 2023-03-11 DIAGNOSIS — O99212 Obesity complicating pregnancy, second trimester: Secondary | ICD-10-CM

## 2023-03-11 DIAGNOSIS — Z3A27 27 weeks gestation of pregnancy: Secondary | ICD-10-CM

## 2023-03-11 NOTE — Progress Notes (Signed)
    Return Prenatal Note   Subjective  38 y.o. Z6X0960 at [redacted]w[redacted]d presents for this follow-up prenatal visit. Pregnancy notable for BMI.   Patient pt has gotten sick from her daughter and feels like there is something in her lungs, wanting to know if she could get an antibiotic.  Does feel like she is overall improving and feeling better now, no fever/chills, GI symptoms. Just coughing.   Patient reports: Movement: Present Contractions: Not present Denies vaginal bleeding or leaking fluid. Objective  Flow sheet Vitals: Pulse Rate: 95 BP: 124/77 Fundal Height: 29 cm Fetal Heart Rate (bpm): 146 Total weight gain: 37 lb (16.8 kg)  General Appearance  No acute distress, well appearing, and well nourished Pulmonary   Normal work of breathing, CTAB, no wheezes, crackles, or rales Neurologic   Alert and oriented to person, place, and time Psychiatric   Mood and affect within normal limits  Assessment/Plan   Plan  38 y.o. [id number] at [redacted]w[redacted]d by LMP=10wk [place] presents for follow-up OB visit. Reviewed prenatal record including previous visit note.  1. Obesity in pregnancy, antepartum (Primary) -Tdap, 28wk labs, and BTC completed today -Third trimester expectations discussed -BMI now >45, anesthesia consult ordered; Q4wk growths; NSTs at 34wks   Orders Placed This Encounter  Procedures   US OB Follow Up    Standing Status:   Future    Expected Date:   03/25/2023    Expiration Date:   03/10/2024    Reason for exam::   BMI, growth    Preferred imaging location?:   Internal   Tdap vaccine greater than or equal to 7yo IM   Return in about 2 weeks (around 03/25/2023) for ROB w/growth [place].   Future Appointments  Date Time Provider Department Center  03/25/2023  9:35 AM Linzie Collin, MD AOB-AOB None  04/08/2023 10:15 AM AOB-AOB [place] 1 AOB-IMG None  04/08/2023 11:15 AM Glenetta Borg, CNM AOB-AOB None   For next visit:  continue with routine prenatal care    Julieanne Manson, DO [place]  [facility]

## 2023-03-11 NOTE — Patient Instructions (Signed)
Third Trimester of Pregnancy  The third trimester of pregnancy is from week 28 through week 40. This is months 7 through 9. The third trimester is a time when your baby is growing fast. Body changes during your third trimester Your body continues to change during this time. The changes usually go away after your baby is born. Physical changes You will continue to gain weight. You may get stretch marks on your hips, belly, and breasts. Your breasts will keep growing and may hurt. A yellow fluid (colostrum) may leak from your breasts. This is the first milk you're making for your baby. Your hair may grow faster and get thicker. In some cases, you may get hair loss. Your belly button may stick out. You may have more swelling in your hands, face, or ankles. Health changes You may have heartburn. You may feel short of breath. This is caused by the uterus that is now bigger. You may have more aches in the pelvis, back, or thighs. You may have more tingling or numbness in your hands, arms, and legs. You may pee more often. You may have trouble pooping (constipation) or swollen veins in the butt that can itch or get painful (hemorrhoids). Other changes You may have more problems sleeping. You may notice the baby moving lower in your belly (dropping). You may have more fluid coming from your vagina. Your joints may feel loose, and you may have pain around your pelvic bone. Follow these instructions at home: Medicines Take medicines only as told by your health care provider. Some medicines are not safe during pregnancy. Your provider may change the medicines that you take. Do not take any medicines unless told to by your provider. Take a prenatal vitamin that has at least 600 micrograms (mcg) of folic acid. Do not use herbal medicines, illegal drugs, or medicines that are not approved by your provider. Eating and drinking While you're pregnant your body needs additional nutrition to help  support your growing baby. Talk with your provider about your nutritional needs. Activity Most women are able to exercise regularly during pregnancy. Exercise routines may need to change at the end of your pregnancy. Talk to your provider about your activities and exercise routine. Relieving pain and discomfort Rest often with your legs raised if you have leg cramps or low back pain. Take warm sitz baths to soothe pain from hemorrhoids. Use hemorrhoid cream if your provider says it's okay. Wear a good, supportive bra if your breasts hurt. Do not use hot tubs, steam rooms, or saunas. Do not douche. Do not use tampons or scented pads. Safety Talk to your provider before traveling far distances. Wear your seatbelt at all times when you're in a car. Talk to your provider if someone hits you, hurts you, or yells at you. Preparing for birth To prepare for your baby: Take childbirth and breastfeeding classes. Visit the hospital and tour the maternity area. Buy a rear-facing car seat. Learn how to install it in your car. General instructions Avoid cat litter boxes and soil used by cats. These things carry germs that can cause harm to your pregnancy and your baby. Do not drink alcohol, smoke, vape, or use products with nicotine or tobacco in them. If you need help quitting, talk with your provider. Keep all follow-up visits for your third trimester. Your provider will do more exams and tests during this trimester. Write down your questions. Take them to your prenatal visits. Your provider also will: Talk with you about  your overall health. Give you advice or refer you to specialists who can help with different needs, including: Mental health and counseling. Foods and healthy eating. Ask for help if you need help with food. Where to find more information American Pregnancy Association: americanpregnancy.org [employer]ians [employer] Gynecologists: acog.org Office on [employer] Health:  [employer] Contact a health care provider if: You have a headache that does not go away when you take medicine. You have any of these problems: You can't eat or drink. You have nausea and vomiting. You have watery poop (diarrhea) for 2 days or more. You have pain when you pee, or your pee smells bad. You have been sick for 2 days or more and aren't getting better. Contact your provider right away if: You have any of these coming from your vagina: Abnormal discharge. Bad-smelling fluid. Bleeding. Your baby is moving less than usual. You have signs of labor: You have any contractions, belly cramping, or have pain in your pelvis or lower back before 37 weeks of pregnancy (preterm labor). You have regular contractions that are less than 5 minutes apart. Your water breaks. You have symptoms of high blood pressure or preeclampsia. These include: A severe, throbbing headache that does not go away. Sudden or extreme swelling of your face, hands, legs, or feet. Vision problems: You see spots. You have blurry vision. Your eyes are sensitive to light. If you can't reach your provider, go to an urgent care or emergency room. Get help right away if: You faint, become confused, or can't think clearly. You have chest pain or trouble breathing. You have any kind of injury, such as from a fall or a car crash. These symptoms may be an emergency. Call 911 right away. Do not wait to see if the symptoms will go away. Do not drive yourself to the hospital. This information is not intended to replace advice given to you by your health care provider. Make sure you discuss any questions you have with your health care provider. Document Revised: 10/29/2022 Document Reviewed: 05/29/2022 Elsevier Patient Education  2024 [employer].

## 2023-03-12 DIAGNOSIS — O99119 Other diseases of the blood and blood-forming organs and certain disorders involving the immune mechanism complicating pregnancy, unspecified trimester: Secondary | ICD-10-CM | POA: Insufficient documentation

## 2023-03-12 DIAGNOSIS — R7309 Other abnormal glucose: Secondary | ICD-10-CM

## 2023-03-12 LAB — 28 WEEK RH+PANEL
Basophils Absolute: 0 10*3/uL (ref 0.0–0.2)
Basos: 0 %
EOS (ABSOLUTE): 0.3 10*3/uL (ref 0.0–0.4)
Eos: 4 %
Gestational Diabetes Screen: 160 mg/dL — ABNORMAL HIGH (ref 70–139)
HIV Screen 4th Generation wRfx: NONREACTIVE
Hematocrit: 37.9 % (ref 34.0–46.6)
Hemoglobin: 12.5 g/dL (ref 11.1–15.9)
Immature Grans (Abs): 0.1 10*3/uL (ref 0.0–0.1)
Immature Granulocytes: 1 %
Lymphocytes Absolute: 1.3 10*3/uL (ref 0.7–3.1)
Lymphs: 16 %
MCH: 29.1 pg (ref 26.6–33.0)
MCHC: 33 g/dL (ref 31.5–35.7)
MCV: 88 fL (ref 79–97)
Monocytes Absolute: 0.5 10*3/uL (ref 0.1–0.9)
Monocytes: 6 %
Neutrophils Absolute: 6 10*3/uL (ref 1.4–7.0)
Neutrophils: 73 %
Platelets: 148 10*3/uL — ABNORMAL LOW (ref 150–450)
RBC: 4.29 x10E6/uL (ref 3.77–5.28)
RDW: 15 % (ref 11.7–15.4)
RPR Ser Ql: NONREACTIVE
WBC: 8.1 10*3/uL (ref 3.4–10.8)

## 2023-03-12 NOTE — Addendum Note (Signed)
Addended by: Autumn Messing on: 03/12/2023 01:14 PM   Modules accepted: Orders

## 2023-03-12 NOTE — Progress Notes (Signed)
Contacted the patient for scheduling. She is scheduled for 2/4 for 3GTT

## 2023-03-16 ENCOUNTER — Other Ambulatory Visit: Payer: No Typology Code available for payment source

## 2023-03-17 ENCOUNTER — Encounter
Admission: RE | Admit: 2023-03-17 | Discharge: 2023-03-17 | Disposition: A | Discharge status: Home / Self Care | Payer: No Typology Code available for payment source | Source: Ambulatory Visit | Attending: *Deleted | Admitting: *Deleted

## 2023-03-17 NOTE — Consult Note (Signed)
 [facility] Anesthesia Consultation  Megan Sullivan FMW:[id number] DOB: 02/12/1985 DOA: 03/17/2023 PCP: Freddrick, No   Requesting physician: Estil Mangle, DO Date of consultation: 03/17/23 Reason for consultation: Obesity during pregnancy  CHIEF COMPLAINT:  Obesity during pregnancy  HISTORY OF PRESENT ILLNESS: Megan Sullivan  is a 38 y.o. female with a known history of asthma, morbid obesity presenting for pre anesthesia evaluation for morbid obesity in setting of pregnancy.  Two prior vaginal deliveries here, two epidurals without issue. No health changes since prior delivery, no pregnancy issues currently.  PAST MEDICAL HISTORY:   Past Medical History:  Diagnosis Date   Asthma    Migraines    Obesity     PAST SURGICAL HISTORY:  Past Surgical History:  Procedure Laterality Date   DILATION AND CURETTAGE OF UTERUS  2018   OVARIAN CYST REMOVAL  2017    SOCIAL HISTORY:  Social History   Tobacco Use   Smoking status: Never   Smokeless tobacco: Never  Substance Use Topics   Alcohol use: Never    FAMILY HISTORY:  Family History  Problem Relation Age of Onset   Hyperlipidemia Father    Diabetes Maternal Grandmother    Healthy Mother    Breast cancer Neg Hx    Ovarian cancer Neg Hx    Colon cancer Neg Hx     DRUG ALLERGIES: No Known Allergies  REVIEW OF SYSTEMS:   RESPIRATORY: No cough, shortness of breath, wheezing.  CARDIOVASCULAR: No chest pain, orthopnea, edema.  HEMATOLOGY: No anemia, easy bruising or bleeding SKIN: No rash or lesion. NEUROLOGIC: No tingling, numbness, weakness.  PSYCHIATRY: No anxiety or depression.   MEDICATIONS AT HOME:  Prior to Admission medications   Medication Sig Start Date End Date Taking? Authorizing Provider  Butalbital -APAP-Caffeine  50-325-40 MG capsule Take 1-2 capsules by mouth every 6 (six) hours as needed for headache. 11/26/22   Janit Alm Agent, MD  magnesium 30 MG tablet Take 500 mg by  mouth 2 (two) times daily.    [provider]  Prenatal MV & Min w/FA-DHA (ONE A DAY PRENATAL PO)  12/31/20   [provider]  SUMAtriptan  (IMITREX ) 50 MG tablet Take 1 tablet (50 mg total) by mouth every 2 (two) hours as needed for migraine. 09/30/21   Connell Davies, MD      PHYSICAL EXAMINATION:   VITAL SIGNS: Last menstrual period 08/28/2022, currently breastfeeding.  GENERAL:  38 y.o.-year-old patient no acute distress.  HEENT: Head atraumatic, normocephalic. Oropharynx and nasopharynx clear. MP 2, TM distance >3 cm, normal mouth opening. LUNGS: No use of accessory muscles of respiration.   EXTREMITIES: No pedal edema, cyanosis, or clubbing.  NEUROLOGIC: normal gait PSYCHIATRIC: The patient is alert and oriented x 3.  SKIN: No obvious rash, lesion, or ulcer.    IMPRESSION AND PLAN:   Megan Sullivan  is a 38 y.o. female presenting with obesity during pregnancy. BMI is currently 46 at [redacted] weeks gestation.   We discussed analgesic options during labor including epidural analgesia. Discussed that in obesity there can be increased difficulty with epidural placement or even failure of successful epidural. We also discussed that even after successful epidural placement there is increased risk of catheter migration out of the epidural space that would require catheter replacement. Discussed use of epidural vs spinal vs GA if cesarean delivery is required. Discussed increased risk of difficult intubation during pregnancy should an emergency cesarean delivery be required.   This patient is appropriate for anesthetic care at [facility]  in [place]. We discussed the limitations of a community hospital including but not limited to staffing, imaging, medications, and blood products. The patient is aware of these limitations. All questions answered and concerns addressed.

## 2023-03-25 ENCOUNTER — Encounter: Payer: Self-pay | Admitting: Obstetrics and Gynecology

## 2023-03-25 ENCOUNTER — Ambulatory Visit (INDEPENDENT_AMBULATORY_CARE_PROVIDER_SITE_OTHER): Payer: No Typology Code available for payment source | Admitting: Obstetrics and Gynecology

## 2023-03-25 ENCOUNTER — Other Ambulatory Visit: Payer: No Typology Code available for payment source

## 2023-03-25 VITALS — BP 136/87 | HR 97 | Wt 279.9 lb

## 2023-03-25 DIAGNOSIS — O99210 Obesity complicating pregnancy, unspecified trimester: Secondary | ICD-10-CM

## 2023-03-25 DIAGNOSIS — Z3493 Encounter for supervision of normal pregnancy, unspecified, third trimester: Secondary | ICD-10-CM | POA: Diagnosis not present

## 2023-03-25 DIAGNOSIS — Z3A29 29 weeks gestation of pregnancy: Secondary | ICD-10-CM | POA: Diagnosis not present

## 2023-03-25 DIAGNOSIS — R7309 Other abnormal glucose: Secondary | ICD-10-CM

## 2023-03-25 DIAGNOSIS — Z3490 Encounter for supervision of normal pregnancy, unspecified, unspecified trimester: Secondary | ICD-10-CM

## 2023-03-25 NOTE — Progress Notes (Signed)
ROB. She states daily fetal movement. Reports finally getting over her "sickness" and feeling better, still having carpal tunnel discomfort. 3 hour GTT today. Growth ultrasound scheduled for 04/08/23. OB Anes. consult completed. Patient states no questions or concerns at this time.

## 2023-03-25 NOTE — Progress Notes (Signed)
ROB: Denies problems.  Reports that she recently had an upper respiratory virus but is now over it.  Daily fetal movement.  Doing 3-hour GTT today.  She has completed her anesthesia consult.  She has an ultrasound scheduled for growth in the next few weeks.

## 2023-03-26 LAB — GESTATIONAL GLUCOSE TOLERANCE
Glucose, Fasting: 90 mg/dL (ref 70–94)
Glucose, GTT - 1 Hour: 172 mg/dL (ref 70–179)
Glucose, GTT - 2 Hour: 136 mg/dL (ref 70–154)
Glucose, GTT - 3 Hour: 101 mg/dL (ref 70–139)

## 2023-04-06 ENCOUNTER — Other Ambulatory Visit: Payer: Self-pay | Admitting: Obstetrics

## 2023-04-06 DIAGNOSIS — Z23 Encounter for immunization: Secondary | ICD-10-CM

## 2023-04-06 DIAGNOSIS — O99210 Obesity complicating pregnancy, unspecified trimester: Secondary | ICD-10-CM

## 2023-04-08 ENCOUNTER — Ambulatory Visit (INDEPENDENT_AMBULATORY_CARE_PROVIDER_SITE_OTHER): Payer: No Typology Code available for payment source | Admitting: Obstetrics

## 2023-04-08 ENCOUNTER — Encounter: Payer: Self-pay | Admitting: Obstetrics

## 2023-04-08 ENCOUNTER — Ambulatory Visit: Payer: No Typology Code available for payment source

## 2023-04-08 VITALS — BP 115/72 | HR 85 | Wt 278.0 lb

## 2023-04-08 DIAGNOSIS — O99213 Obesity complicating pregnancy, third trimester: Secondary | ICD-10-CM

## 2023-04-08 DIAGNOSIS — O99210 Obesity complicating pregnancy, unspecified trimester: Secondary | ICD-10-CM | POA: Diagnosis not present

## 2023-04-08 DIAGNOSIS — Z3A31 31 weeks gestation of pregnancy: Secondary | ICD-10-CM | POA: Diagnosis not present

## 2023-04-08 DIAGNOSIS — Z3A32 32 weeks gestation of pregnancy: Secondary | ICD-10-CM

## 2023-04-08 NOTE — Assessment & Plan Note (Addendum)
-  Reviewed comfort measures for carpal tunnel -OK to proceed with lesion removal with local anesthetic -Reviewed s/s of PTL and when to go to the hospital -Growth [place] later today - serial scans q4 weeks -Reviewed normal 3-hour glucose results

## 2023-04-08 NOTE — Progress Notes (Signed)
    Return Prenatal Note   Assessment/Plan   Plan  38 y.o. [id number] at [redacted]w[redacted]d presents for follow-up OB visit. Reviewed prenatal record including previous visit note.  Gestational thrombocytopenia (HCC) -Will recheck platelets at 34/36 weeks  Obesity in pregnancy, antepartum -Reviewed comfort measures for carpal tunnel -OK to proceed with lesion removal with local anesthetic -Reviewed s/s of PTL and when to go to the hospital -Growth [place] later today - serial scans q4 weeks -Reviewed normal 3-hour glucose results    No orders of the defined types were placed in this encounter.  Return in about 2 weeks (around 04/22/2023).   Future Appointments  Date Time Provider Department Center  04/22/2023  8:15 AM Dominic, Courtney Heys, CNM AOB-AOB None  05/06/2023 10:15 AM AOB-AOB [place] 1 AOB-IMG None    For next visit:  Routine prenatal care    Subjective   Arionna is having carpal tunnel pain. She has a lesion in her cheek that her oral surgeon recommended removal. She wants to be sure this is safe in pregnancy. It would be a brief procedure done with lidocaine. She reports good fetal movement.  Movement: Present Contractions: Irritability  Objective   Flow sheet Vitals: Pulse Rate: 85 BP: 115/72 Fundal Height: 33 cm Fetal Heart Rate (bpm): 138 Total weight gain: 40 lb (18.1 kg)  General Appearance  No acute distress, well appearing, and well nourished Pulmonary   Normal work of breathing Neurologic   Alert and oriented to person, place, and time Psychiatric   Mood and affect within normal limits  Guadlupe Spanish, CNM 04/08/23 4:44 PM

## 2023-04-08 NOTE — Assessment & Plan Note (Signed)
-  Will recheck platelets at 34/36 weeks

## 2023-04-22 ENCOUNTER — Encounter: Payer: Self-pay | Admitting: Licensed Practical Nurse

## 2023-04-22 ENCOUNTER — Ambulatory Visit (INDEPENDENT_AMBULATORY_CARE_PROVIDER_SITE_OTHER): Payer: No Typology Code available for payment source | Admitting: Licensed Practical Nurse

## 2023-04-22 VITALS — BP 134/82 | HR 91 | Wt 279.9 lb

## 2023-04-22 DIAGNOSIS — O99113 Other diseases of the blood and blood-forming organs and certain disorders involving the immune mechanism complicating pregnancy, third trimester: Secondary | ICD-10-CM

## 2023-04-22 DIAGNOSIS — D696 Thrombocytopenia, unspecified: Secondary | ICD-10-CM | POA: Diagnosis not present

## 2023-04-22 DIAGNOSIS — O99210 Obesity complicating pregnancy, unspecified trimester: Secondary | ICD-10-CM

## 2023-04-22 DIAGNOSIS — Z3A33 33 weeks gestation of pregnancy: Secondary | ICD-10-CM

## 2023-04-22 DIAGNOSIS — O99213 Obesity complicating pregnancy, third trimester: Secondary | ICD-10-CM | POA: Diagnosis not present

## 2023-04-22 DIAGNOSIS — Z3490 Encounter for supervision of normal pregnancy, unspecified, unspecified trimester: Secondary | ICD-10-CM

## 2023-04-22 LAB — POCT URINALYSIS DIPSTICK
Bilirubin, UA: NEGATIVE
Blood, UA: NEGATIVE
Glucose, UA: NEGATIVE
Ketones, UA: NEGATIVE
Leukocytes, UA: NEGATIVE
Nitrite, UA: NEGATIVE
Protein, UA: NEGATIVE
Spec Grav, UA: 1.015 (ref 1.010–1.025)
Urobilinogen, UA: 0.2 U/dL
pH, UA: 6.5 (ref 5.0–8.0)

## 2023-04-22 NOTE — Assessment & Plan Note (Signed)
 Needs lab draw next visit

## 2023-04-22 NOTE — Assessment & Plan Note (Deleted)
-  had anesthesia consult -reviewed weekly testing and birth by due date

## 2023-04-22 NOTE — Progress Notes (Signed)
    Return Prenatal Note   Subjective   38 y.o. [id number] at [redacted]w[redacted]d presents for this follow-up prenatal visit.  Patient Doing well, admits to feeling tired and having heartburn.  Patient reports: Movement: Present Contractions: Irritability  Objective   Flow sheet Vitals: Pulse Rate: 91 BP: 134/82 Fundal Height: 37 cm Fetal Heart Rate (bpm): 135 Total weight gain: 41 lb 14.4 oz (19 kg)  General Appearance  No acute distress, well appearing, and well nourished Pulmonary   Normal work of breathing Neurologic   Alert and oriented to person, place, and time Psychiatric   Mood and affect within normal limits  Assessment/Plan   Plan  38 y.o. [id number] at [redacted]w[redacted]d presents for follow-up OB visit. Reviewed prenatal record including previous visit note.  Gestational thrombocytopenia [facility]) Needs lab draw next visit   Obesity in pregnancy, antepartum -had anesthesia consult -reviewed weekly testing and birth by due date -BPP on 3/27 -May consider another pregnancy, considering IUD -TWG 41 lbs, recommend increasing physical activity, has had a decreased appetite,  -Her parents will watch the children while in labor, her husband will gets 12 wk parental leave  -planning epidural  -36wk labs next visit      Orders Placed This Encounter  Procedures   POCT Urinalysis Dipstick   Return in about 2 weeks (around 05/06/2023) for ROB, 36wk labs, NST.   Future Appointments  Date Time Provider Department Center  05/06/2023 10:15 AM AOB-AOB [place] 1 AOB-IMG None  05/06/2023 11:15 AM Doreene Burke, CNM AOB-AOB None    For next visit:  ROB with GBS screening      Ellouise Newer [facility], CNM  04/22/2510:26 PM

## 2023-04-22 NOTE — Assessment & Plan Note (Addendum)
-  had anesthesia consult -reviewed weekly testing and birth by due date -BPP on 3/27 -May consider another pregnancy, considering IUD -TWG 41 lbs, recommend increasing physical activity, has had a decreased appetite,  -Her parents will watch the children while in labor, her husband will gets 12 wk parental leave  -planning epidural  -36wk labs next visit

## 2023-05-06 ENCOUNTER — Other Ambulatory Visit: Payer: Self-pay | Admitting: Obstetrics

## 2023-05-06 ENCOUNTER — Ambulatory Visit (INDEPENDENT_AMBULATORY_CARE_PROVIDER_SITE_OTHER): Admitting: Certified Nurse Midwife

## 2023-05-06 ENCOUNTER — Ambulatory Visit: Payer: No Typology Code available for payment source

## 2023-05-06 ENCOUNTER — Other Ambulatory Visit (HOSPITAL_COMMUNITY)
Admission: RE | Admit: 2023-05-06 | Discharge: 2023-05-06 | Disposition: A | Discharge status: Home / Self Care | Source: Ambulatory Visit | Attending: Certified Nurse Midwife | Admitting: Certified Nurse Midwife

## 2023-05-06 ENCOUNTER — Encounter: Payer: Self-pay | Admitting: Certified Nurse Midwife

## 2023-05-06 VITALS — BP 120/80 | HR 89 | Wt 280.7 lb

## 2023-05-06 DIAGNOSIS — Z113 Encounter for screening for infections with a predominantly sexual mode of transmission: Secondary | ICD-10-CM

## 2023-05-06 DIAGNOSIS — Z3A35 35 weeks gestation of pregnancy: Secondary | ICD-10-CM | POA: Diagnosis not present

## 2023-05-06 DIAGNOSIS — Z3685 Encounter for antenatal screening for Streptococcus B: Secondary | ICD-10-CM

## 2023-05-06 DIAGNOSIS — O99213 Obesity complicating pregnancy, third trimester: Secondary | ICD-10-CM

## 2023-05-06 DIAGNOSIS — Z3483 Encounter for supervision of other normal pregnancy, third trimester: Secondary | ICD-10-CM | POA: Diagnosis not present

## 2023-05-06 NOTE — Patient Instructions (Signed)

## 2023-05-06 NOTE — Progress Notes (Signed)
 ROB doing well. U/s today for growth/BPP due to BMI . BPP 8/8 . GBS swab and culture collected today. Follow up 1 wk.   Doreene Burke, CNM    Patient Name: Megan Sullivan DOB: Jun 12, 1985 MRN: [id number]    ULTRASOUND REPORT  Location: [place] [facility] Date of Service: 05/06/2023   Indications:maternal morbid obesity  Findings:  [name] intrauterine pregnancy is visualized with FHR at 150 bpm.   [name] gives an (U/S) Gestational age of [redacted]w[redacted]d and an (U/S) EDD of 05/26/2023; this correlates with the clinically established Estimated Date of Delivery: 06/04/23.   Fetal presentation is Cephalic.  Placenta: anterior.  Normal AFI: 17.3 cm, MVP 6.9 cm  Growth is in the  90th percentile   AC is in the 99th percentile. EFW: 3265g, 7lb 3oz   Incidental BPP 8/8  Impression: 1. [redacted]w[redacted]d Viable Single Intrauterine pregnancy dated by previously established criteria. 2. Growth is 90th percentile. Normal AFI is 17.3 cm.   Recommendations: 1.Clinical correlation with the patient's History and Physical Exam.   Katheran Awe, RDMS

## 2023-05-08 LAB — STREP GP B NAA: Strep Gp B NAA: POSITIVE — AB

## 2023-05-10 LAB — CERVICOVAGINAL ANCILLARY ONLY
Chlamydia: NEGATIVE
Comment: NEGATIVE
Comment: NORMAL
Neisseria Gonorrhea: NEGATIVE

## 2023-05-11 ENCOUNTER — Other Ambulatory Visit

## 2023-05-11 ENCOUNTER — Ambulatory Visit (INDEPENDENT_AMBULATORY_CARE_PROVIDER_SITE_OTHER): Admitting: Certified Nurse Midwife

## 2023-05-11 VITALS — BP 131/84 | HR 98 | Wt 283.7 lb

## 2023-05-11 DIAGNOSIS — O99119 Other diseases of the blood and blood-forming organs and certain disorders involving the immune mechanism complicating pregnancy, unspecified trimester: Secondary | ICD-10-CM

## 2023-05-11 DIAGNOSIS — D696 Thrombocytopenia, unspecified: Secondary | ICD-10-CM | POA: Diagnosis not present

## 2023-05-11 DIAGNOSIS — Z3A36 36 weeks gestation of pregnancy: Secondary | ICD-10-CM | POA: Diagnosis not present

## 2023-05-11 DIAGNOSIS — O99113 Other diseases of the blood and blood-forming organs and certain disorders involving the immune mechanism complicating pregnancy, third trimester: Secondary | ICD-10-CM | POA: Diagnosis not present

## 2023-05-11 DIAGNOSIS — O0990 Supervision of high risk pregnancy, unspecified, unspecified trimester: Secondary | ICD-10-CM

## 2023-05-11 NOTE — Assessment & Plan Note (Signed)
 Reviewed kick counts and preterm labor warning signs. Instructed to call office or come to hospital with persistent headache, vision changes, regular contractions, leaking of fluid, decreased fetal movement or vaginal bleeding.

## 2023-05-11 NOTE — Patient Instructions (Signed)
 Third Trimester of Pregnancy  The third trimester of pregnancy is from week 28 through week 40. This is months 7 through 9. The third trimester is a time when your baby is growing fast. Body changes during your third trimester Your body continues to change during this time. The changes usually go away after your baby is born. Physical changes You will continue to gain weight. You may get stretch marks on your hips, belly, and breasts. Your breasts will keep growing and may hurt. A yellow fluid (colostrum) may leak from your breasts. This is the first milk you're making for your baby. Your hair may grow faster and get thicker. In some cases, you may get hair loss. Your belly button may stick out. You may have more swelling in your hands, face, or ankles. Health changes You may have heartburn. You may feel short of breath. This is caused by the uterus that is now bigger. You may have more aches in the pelvis, back, or thighs. You may have more tingling or numbness in your hands, arms, and legs. You may pee more often. You may have trouble pooping (constipation) or swollen veins in the butt that can itch or get painful (hemorrhoids). Other changes You may have more problems sleeping. You may notice the baby moving lower in your belly (dropping). You may have more fluid coming from your vagina. Your joints may feel loose, and you may have pain around your pelvic bone. Follow these instructions at home: Medicines Take medicines only as told by your health care provider. Some medicines are not safe during pregnancy. Your provider may change the medicines that you take. Do not take any medicines unless told to by your provider. Take a prenatal vitamin that has at least 600 micrograms (mcg) of folic acid. Do not use herbal medicines, illegal drugs, or medicines that are not approved by your provider. Eating and drinking While you're pregnant your body needs additional nutrition to help  support your growing baby. Talk with your provider about your nutritional needs. Activity Most women are able to exercise regularly during pregnancy. Exercise routines may need to change at the end of your pregnancy. Talk to your provider about your activities and exercise routine. Relieving pain and discomfort Rest often with your legs raised if you have leg cramps or low back pain. Take warm sitz baths to soothe pain from hemorrhoids. Use hemorrhoid cream if your provider says it's okay. Wear a good, supportive bra if your breasts hurt. Do not use hot tubs, steam rooms, or saunas. Do not douche. Do not use tampons or scented pads. Safety Talk to your provider before traveling far distances. Wear your seatbelt at all times when you're in a car. Talk to your provider if someone hits you, hurts you, or yells at you. Preparing for birth To prepare for your baby: Take childbirth and breastfeeding classes. Visit the hospital and tour the maternity area. Buy a rear-facing car seat. Learn how to install it in your car. General instructions Avoid cat litter boxes and soil used by cats. These things carry germs that can cause harm to your pregnancy and your baby. Do not drink alcohol, smoke, vape, or use products with nicotine or tobacco in them. If you need help quitting, talk with your provider. Keep all follow-up visits for your third trimester. Your provider will do more exams and tests during this trimester. Write down your questions. Take them to your prenatal visits. Your provider also will: Talk with you about  your overall health. Give you advice or refer you to specialists who can help with different needs, including: Mental health and counseling. Foods and healthy eating. Ask for help if you need help with food. Where to find more information American Pregnancy Association: americanpregnancy.org [employer]ians [employer] Gynecologists: acog.org Office on [employer] Health:  [employer] Contact a health care provider if: You have a headache that does not go away when you take medicine. You have any of these problems: You can't eat or drink. You have nausea and vomiting. You have watery poop (diarrhea) for 2 days or more. You have pain when you pee, or your pee smells bad. You have been sick for 2 days or more and aren't getting better. Contact your provider right away if: You have any of these coming from your vagina: Abnormal discharge. Bad-smelling fluid. Bleeding. Your baby is moving less than usual. You have signs of labor: You have any contractions, belly cramping, or have pain in your pelvis or lower back before 37 weeks of pregnancy (preterm labor). You have regular contractions that are less than 5 minutes apart. Your water breaks. You have symptoms of high blood pressure or preeclampsia. These include: A severe, throbbing headache that does not go away. Sudden or extreme swelling of your face, hands, legs, or feet. Vision problems: You see spots. You have blurry vision. Your eyes are sensitive to light. If you can't reach your provider, go to an urgent care or emergency room. Get help right away if: You faint, become confused, or can't think clearly. You have chest pain or trouble breathing. You have any kind of injury, such as from a fall or a car crash. These symptoms may be an emergency. Call 911 right away. Do not wait to see if the symptoms will go away. Do not drive yourself to the hospital. This information is not intended to replace advice given to you by your health care provider. Make sure you discuss any questions you have with your health care provider. Document Revised: 10/29/2022 Document Reviewed: 05/29/2022 Elsevier Patient Education  2024 [employer].

## 2023-05-11 NOTE — Assessment & Plan Note (Signed)
 Plan CBC at next visit

## 2023-05-11 NOTE — Progress Notes (Signed)
    Return Prenatal Note   Subjective   38 y.o. [id number] at [redacted]w[redacted]d presents for this follow-up prenatal visit.  Patient feeling well, active baby. NST reactive today, will continue weekly Patient reports: Movement: Present Contractions: Irregular  Objective   Flow sheet Vitals: Pulse Rate: 98 BP: 131/84 Fundal Height: 37 cm Fetal Heart Rate (bpm): 145 Presentation: Vertex ([place]) Total weight gain: 45 lb 11.2 oz (20.7 kg)  General Appearance  No acute distress, well appearing, and well nourished Pulmonary   Normal work of breathing Neurologic   Alert and oriented to person, place, and time Psychiatric   Mood and affect within normal limits  Assessment/Plan   Plan  38 y.o. [id number] at [redacted]w[redacted]d presents for follow-up OB visit. Reviewed prenatal record including previous visit note.  Gestational thrombocytopenia (HCC) Plan CBC at next visit  Supervision of high risk pregnancy, antepartum Reviewed kick counts and preterm labor warning signs. Instructed to call office or come to hospital with persistent headache, vision changes, regular contractions, leaking of fluid, decreased fetal movement or vaginal bleeding.      Orders Placed This Encounter  Procedures   CBC With Diff/Platelet    Standing Status:   Future    Expected Date:   05/18/2023    Expiration Date:   05/10/2024   Return in 1 week (on 05/18/2023) for ROB & NST.   Future Appointments  Date Time Provider Department Center  05/18/2023  9:15 AM [facility]ST ROOM [facility] None  05/18/2023 10:35 AM Tresea Mall, CNM [facility] None    For next visit:  continue with routine prenatal care     Dominica Severin, CNM  04/01/255:13 PM

## 2023-05-18 ENCOUNTER — Other Ambulatory Visit

## 2023-05-18 ENCOUNTER — Ambulatory Visit (INDEPENDENT_AMBULATORY_CARE_PROVIDER_SITE_OTHER): Admitting: Advanced Practice Midwife

## 2023-05-18 ENCOUNTER — Encounter: Payer: Self-pay | Admitting: Advanced Practice Midwife

## 2023-05-18 VITALS — BP 115/78 | HR 94 | Wt 281.6 lb

## 2023-05-18 DIAGNOSIS — D696 Thrombocytopenia, unspecified: Secondary | ICD-10-CM

## 2023-05-18 DIAGNOSIS — O0990 Supervision of high risk pregnancy, unspecified, unspecified trimester: Secondary | ICD-10-CM

## 2023-05-18 DIAGNOSIS — O99213 Obesity complicating pregnancy, third trimester: Secondary | ICD-10-CM

## 2023-05-18 DIAGNOSIS — O99113 Other diseases of the blood and blood-forming organs and certain disorders involving the immune mechanism complicating pregnancy, third trimester: Secondary | ICD-10-CM

## 2023-05-18 DIAGNOSIS — E669 Obesity, unspecified: Secondary | ICD-10-CM

## 2023-05-18 DIAGNOSIS — Z3A37 37 weeks gestation of pregnancy: Secondary | ICD-10-CM

## 2023-05-18 DIAGNOSIS — O99119 Other diseases of the blood and blood-forming organs and certain disorders involving the immune mechanism complicating pregnancy, unspecified trimester: Secondary | ICD-10-CM

## 2023-05-18 NOTE — Patient Instructions (Signed)

## 2023-05-18 NOTE — Progress Notes (Signed)
 Routine Prenatal Care Visit  Subjective  Megan Sullivan is a 38 y.o. [id number] at [redacted]w[redacted]d being seen today for ongoing prenatal care.  She is currently monitored for the following issues for this high-risk pregnancy and has Sickle cell trait (HCC); Obesity, morbid, BMI 40.0-49.9 (HCC); Obesity affecting pregnancy in third trimester; Supervision of high risk pregnancy, antepartum; Gestational thrombocytopenia (HCC); [redacted] weeks gestation of pregnancy; and Hx of migraines on their problem list.  ----------------------------------------------------------------------------------- Patient reports some pelvic pain that seems to be in pubic symphysis.   Contractions: Irregular. Vag. Bleeding: None.  Movement: Present. Leaking Fluid denies.  ----------------------------------------------------------------------------------- The following portions of the patient's history were reviewed and updated as appropriate: allergies, current medications, past family history, past medical history, past social history, past surgical history and problem list. Problem list updated.  Objective  Blood pressure 115/78, pulse 94, weight 281 lb 9.6 oz (127.7 kg), last menstrual period 08/28/2022, currently breastfeeding. Pregravid weight 238 lb (108 kg) Total Weight Gain 43 lb 9.6 oz (19.8 kg) Urinalysis: Urine Protein    Urine Glucose    Fetal Status: Fetal Heart Rate (bpm): 130   Movement: Present     Reactive 20 minute tracing; 130 bpm, moderate variability, +accelerations, variable seen  General:  Alert, oriented and cooperative. Patient is in no acute distress.  Skin: Skin is warm and dry. No rash noted.   Cardiovascular: Normal heart rate noted  Respiratory: Normal respiratory effort, no problems with respiration noted  Abdomen: Soft, gravid, appropriate for gestational age. Pain/Pressure: Present     Pelvic:  Cervical exam deferred        Extremities: Normal range of motion.  Edema: None  Mental Status: Normal mood  and affect. Normal behavior. Normal judgment and thought content.   Assessment   38 y.o. [id number] at [redacted]w[redacted]d by  06/04/2023, by Last Menstrual Period presenting for routine prenatal visit  Plan   G4 Problems (from 10/28/22 to present)     Problem Noted Diagnosed Resolved   Gestational thrombocytopenia (HCC) 03/12/2023 by Burney Gauze, CNM  No   Overview Signed 03/12/2023  1:15 PM by Burney Gauze, CNM  28 wks: plts 148      Supervision of high risk pregnancy, antepartum 10/28/2022 by Loman Chroman, CMA  No   Overview Addendum 05/18/2023  9:56 AM by Rocco Serene, LPN   Clinical Staff Provider  Office Location  [facility] Dating  06/01/2023, Date entered prior to episode creation  Language  English Anatomy US    Flu Vaccine  11/26/22 Genetic Screen  NIPS:   TDaP vaccine   03/11/23 Hgb A1C or  GTT Early : Third trimester :   Covid yes   LAB RESULTS   Rhogam  O/Positive/-- (10/17 9563)  Blood Type O/Positive/-- (10/17 8756)   RSV N/A Antibody Negative (10/17 0907)  Feeding Plan breast Rubella 2.64 (10/17 4332)  Contraception Paragard? RPR Non Reactive (10/17 0907)   Circumcision Yes HBsAg Negative (10/17 0907)   Pediatrician  [facility] HIV Non Reactive (10/17 9518)  Support Person husband Varicella Reactive (10/17 0907)  Prenatal Classes N/a GBS  (For PCN allergy, check sensitivities)     Hep C Non Reactive (10/17 0907)   BTL Consent  Pap Diagnosis  Date Value Ref Range Status  12/31/2020   Final   - Negative for intraepithelial lesion or malignancy (NILM)    VBAC Consent  Hgb Electro      CF      SMA  Term labor symptoms and general obstetric precautions including but not limited to vaginal bleeding, contractions, leaking of fluid and fetal movement were reviewed in detail with the patient. Please refer to After Visit Summary for other counseling recommendations.   Return in about 1 week (around 05/25/2023) for nst/rob.  Tresea Mall, CNM 05/18/2023 10:53 AM

## 2023-05-19 ENCOUNTER — Encounter: Payer: Self-pay | Admitting: Certified Nurse Midwife

## 2023-05-19 LAB — CBC WITH DIFF/PLATELET
Basophils Absolute: 0 10*3/uL (ref 0.0–0.2)
Basos: 0 %
EOS (ABSOLUTE): 0.1 10*3/uL (ref 0.0–0.4)
Eos: 1 %
Hematocrit: 41 % (ref 34.0–46.6)
Hemoglobin: 13.6 g/dL (ref 11.1–15.9)
Immature Grans (Abs): 0.1 10*3/uL (ref 0.0–0.1)
Immature Granulocytes: 1 %
Lymphocytes Absolute: 1.5 10*3/uL (ref 0.7–3.1)
Lymphs: 14 %
MCH: 28.4 pg (ref 26.6–33.0)
MCHC: 33.2 g/dL (ref 31.5–35.7)
MCV: 86 fL (ref 79–97)
Monocytes Absolute: 0.8 10*3/uL (ref 0.1–0.9)
Monocytes: 8 %
Neutrophils Absolute: 8.2 10*3/uL — ABNORMAL HIGH (ref 1.4–7.0)
Neutrophils: 76 %
Platelets: 154 10*3/uL (ref 150–450)
RBC: 4.79 x10E6/uL (ref 3.77–5.28)
RDW: 15.8 % — ABNORMAL HIGH (ref 11.7–15.4)
WBC: 10.7 10*3/uL (ref 3.4–10.8)

## 2023-05-25 ENCOUNTER — Ambulatory Visit: Admitting: Obstetrics

## 2023-05-25 ENCOUNTER — Other Ambulatory Visit

## 2023-05-25 VITALS — BP 137/84 | HR 105 | Wt 284.5 lb

## 2023-05-25 DIAGNOSIS — Z3A38 38 weeks gestation of pregnancy: Secondary | ICD-10-CM

## 2023-05-25 DIAGNOSIS — O99113 Other diseases of the blood and blood-forming organs and certain disorders involving the immune mechanism complicating pregnancy, third trimester: Secondary | ICD-10-CM | POA: Diagnosis not present

## 2023-05-25 DIAGNOSIS — O99213 Obesity complicating pregnancy, third trimester: Secondary | ICD-10-CM | POA: Diagnosis not present

## 2023-05-25 DIAGNOSIS — D573 Sickle-cell trait: Secondary | ICD-10-CM

## 2023-05-25 DIAGNOSIS — D696 Thrombocytopenia, unspecified: Secondary | ICD-10-CM

## 2023-05-25 DIAGNOSIS — Z8669 Personal history of other diseases of the nervous system and sense organs: Secondary | ICD-10-CM

## 2023-05-25 DIAGNOSIS — O0990 Supervision of high risk pregnancy, unspecified, unspecified trimester: Secondary | ICD-10-CM

## 2023-05-25 NOTE — Assessment & Plan Note (Deleted)
-  RNST today. BPP ordered for next week. -IOL scheduled for 06/03/23 @ [facility]

## 2023-05-25 NOTE — Patient Instructions (Signed)

## 2023-05-25 NOTE — Assessment & Plan Note (Addendum)
-  Reviewed labor warning signs. Instructed to call office or come to hospital with persistent headache, vision changes, regular contractions, leaking of fluid, decreased fetal movement or vaginal bleeding.

## 2023-05-25 NOTE — Assessment & Plan Note (Signed)
-  RNST today. BPP ordered for next week. -IOL scheduled for 06/03/23 @ [facility] per Dorian's preference

## 2023-05-25 NOTE — Progress Notes (Signed)
    Return Prenatal Note   Assessment/Plan   Plan  38 y.o. [id number] at [redacted]w[redacted]d presents for follow-up OB visit. Reviewed prenatal record including previous visit note.  Supervision of high risk pregnancy, antepartum -Reviewed labor warning signs. Instructed to call office or come to hospital with persistent headache, vision changes, regular contractions, leaking of fluid, decreased fetal movement or vaginal bleeding.    Obesity, morbid, BMI 40.0-49.9 (HCC) -RNST today. BPP ordered for next week. -IOL scheduled for 06/03/23 @ [facility] per Maral's preference   Orders Placed This Encounter  Procedures   US  FETAL BPP WO NON STRESS    Standing Status:   Future    Expected Date:   06/01/2023    Expiration Date:   05/24/2024    Reason for Exam (SYMPTOM  OR DIAGNOSIS REQUIRED):   BMI>45    Preferred Imaging Location?:   Internal   Return in about 1 week (around 06/01/2023).   Future Appointments  Date Time Provider Department Center  06/01/2023 10:15 AM [facility]OB-NST ROOM [facility] None  06/01/2023 10:35 AM Zenobia Hila, MD [facility] None    For next visit:  ROB with NST or BPP    Subjective   Megan Sullivan is having some runs of contractions but nothing regular. She desires IOL by her EDD. EFW>90%ile, AC 99%ile.  Movement: Present Contractions: Irritability  Objective   Flow sheet Vitals: Pulse Rate: (!) 105 BP: 137/84 Fundal Height: 38 cm Fetal Heart Rate (bpm): see NST Total weight gain: 46 lb 8 oz (21.1 kg)   Megan Sullivan [birth year] [redacted]w[redacted]d  Non-Stress Test Interpretation for 05/25/23  Indication:  BMI>45  Fetal Heart Rate A Mode: External Baseline Rate (A): 140 bpm Variability: Moderate Accelerations: 15 x 15 Decelerations: None Multiple birth?: No  Uterine Activity Mode: Toco Contraction Frequency (min): Rare Contraction Duration (sec): 60 Contraction Quality: Mild Resting Time: Adequate  Interpretation (Fetal Testing) Nonstress Test Interpretation:  Reactive Overall Impression: Reassuring for gestational age     General Appearance  No acute distress, well appearing, and well nourished Pulmonary   Normal work of breathing Neurologic   Alert and oriented to person, place, and time Psychiatric   Mood and affect within normal limits  Josue Nip, CNM 05/25/23 11:20 AM

## 2023-06-01 ENCOUNTER — Other Ambulatory Visit

## 2023-06-01 ENCOUNTER — Ambulatory Visit: Admitting: Obstetrics and Gynecology

## 2023-06-01 ENCOUNTER — Encounter: Payer: Self-pay | Admitting: Obstetrics and Gynecology

## 2023-06-01 ENCOUNTER — Encounter: Admitting: Obstetrics & Gynecology

## 2023-06-01 VITALS — BP 117/87 | HR 109 | Wt 284.1 lb

## 2023-06-01 DIAGNOSIS — Z3A39 39 weeks gestation of pregnancy: Secondary | ICD-10-CM

## 2023-06-01 DIAGNOSIS — E669 Obesity, unspecified: Secondary | ICD-10-CM | POA: Diagnosis not present

## 2023-06-01 DIAGNOSIS — O0990 Supervision of high risk pregnancy, unspecified, unspecified trimester: Secondary | ICD-10-CM

## 2023-06-01 DIAGNOSIS — O99213 Obesity complicating pregnancy, third trimester: Secondary | ICD-10-CM

## 2023-06-01 NOTE — Progress Notes (Signed)
 ROB [redacted]w[redacted]d: NST today. Patient report good fetal movement. No concerns. Induction scheduled 4/24@midnight .  Megan Sullivan 01-03-1986 [redacted]w[redacted]d  Fetus A Non-Stress Test Interpretation for 06/01/23  Indication:  Obesity  Fetal Heart Rate A Mode: External Variability: Moderate Accelerations: 15 x 15 Decelerations: None Scalp Stimulation: Negative Multiple birth?: No  Uterine Activity Mode: Toco Contraction Frequency (min): Rare Contraction Duration (sec): 120 Contraction Quality: Mild Resting Time: Adequate  Interpretation (Fetal Testing) Nonstress Test Interpretation: Reactive Overall Impression: Reassuring for gestational age

## 2023-06-01 NOTE — Patient Instructions (Signed)

## 2023-06-01 NOTE — Progress Notes (Signed)
 ROB:  EGA = 39.4.  She reports she is well.  Having occasional contractions.  Describes daily fetal movement.  NST today for elevated BMI is reactive.  Signs symptoms of labor discussed.  She is scheduled for induction tomorrow at midnight.

## 2023-06-02 ENCOUNTER — Other Ambulatory Visit: Payer: Self-pay | Admitting: Certified Nurse Midwife

## 2023-06-03 ENCOUNTER — Other Ambulatory Visit: Payer: Self-pay

## 2023-06-03 ENCOUNTER — Encounter: Payer: Self-pay | Admitting: Obstetrics

## 2023-06-03 ENCOUNTER — Inpatient Hospital Stay
Admission: EM | Admit: 2023-06-03 | Discharge: 2023-06-04 | DRG: 806 | Disposition: A | Discharge status: Home / Self Care | Attending: Licensed Practical Nurse | Admitting: Licensed Practical Nurse

## 2023-06-03 ENCOUNTER — Inpatient Hospital Stay: Admitting: Anesthesiology

## 2023-06-03 DIAGNOSIS — D6959 Other secondary thrombocytopenia: Secondary | ICD-10-CM | POA: Diagnosis present

## 2023-06-03 DIAGNOSIS — D573 Sickle-cell trait: Secondary | ICD-10-CM | POA: Diagnosis present

## 2023-06-03 DIAGNOSIS — D696 Thrombocytopenia, unspecified: Secondary | ICD-10-CM | POA: Diagnosis present

## 2023-06-03 DIAGNOSIS — O99820 Streptococcus B carrier state complicating pregnancy: Secondary | ICD-10-CM

## 2023-06-03 DIAGNOSIS — E669 Obesity, unspecified: Secondary | ICD-10-CM | POA: Diagnosis not present

## 2023-06-03 DIAGNOSIS — O9902 Anemia complicating childbirth: Secondary | ICD-10-CM | POA: Diagnosis present

## 2023-06-03 DIAGNOSIS — O9912 Other diseases of the blood and blood-forming organs and certain disorders involving the immune mechanism complicating childbirth: Secondary | ICD-10-CM | POA: Diagnosis present

## 2023-06-03 DIAGNOSIS — O99824 Streptococcus B carrier state complicating childbirth: Secondary | ICD-10-CM | POA: Diagnosis present

## 2023-06-03 DIAGNOSIS — Z3A39 39 weeks gestation of pregnancy: Secondary | ICD-10-CM

## 2023-06-03 DIAGNOSIS — O99214 Obesity complicating childbirth: Principal | ICD-10-CM | POA: Diagnosis present

## 2023-06-03 LAB — TYPE AND SCREEN
ABO/RH(D): O POS
Antibody Screen: NEGATIVE

## 2023-06-03 LAB — CBC
HCT: 38.1 % (ref 36.0–46.0)
Hemoglobin: 13.2 g/dL (ref 12.0–15.0)
MCH: 28.1 pg (ref 26.0–34.0)
MCHC: 34.6 g/dL (ref 30.0–36.0)
MCV: 81.1 fL (ref 80.0–100.0)
Platelets: 149 10*3/uL — ABNORMAL LOW (ref 150–400)
RBC: 4.7 MIL/uL (ref 3.87–5.11)
RDW: 15.9 % — ABNORMAL HIGH (ref 11.5–15.5)
WBC: 12.1 10*3/uL — ABNORMAL HIGH (ref 4.0–10.5)
nRBC: 0 % (ref 0.0–0.2)

## 2023-06-03 LAB — RPR: RPR Ser Ql: NONREACTIVE

## 2023-06-03 MED ORDER — DIBUCAINE (PERIANAL) 1 % EX OINT
1.0000 | TOPICAL_OINTMENT | CUTANEOUS | Status: DC | PRN
  Filled 2023-06-03: qty 28

## 2023-06-03 MED ORDER — LACTATED RINGERS IV SOLN
500.0000 mL | INTRAVENOUS | Status: DC | PRN
  Administered 2023-06-03: 500 mL via INTRAVENOUS

## 2023-06-03 MED ORDER — OXYTOCIN BOLUS FROM INFUSION
333.0000 mL | Freq: Once | INTRAVENOUS | Status: AC
  Administered 2023-06-03: 333 mL via INTRAVENOUS

## 2023-06-03 MED ORDER — CALCIUM CARBONATE ANTACID 500 MG PO CHEW: 2.0000 | CHEWABLE_TABLET | ORAL | Status: DC | PRN

## 2023-06-03 MED ORDER — TERBUTALINE SULFATE 1 MG/ML IJ SOLN: 0.2500 mg | Freq: Once | INTRAMUSCULAR | Status: DC | PRN

## 2023-06-03 MED ORDER — ONDANSETRON HCL 4 MG/2ML IJ SOLN: 4.0000 mg | INTRAMUSCULAR | Status: DC | PRN

## 2023-06-03 MED ORDER — PENICILLIN G POT IN DEXTROSE 60000 UNIT/ML IV SOLN
3.0000 10*6.[IU] | INTRAVENOUS | Status: DC
  Administered 2023-06-03: 3 10*6.[IU] via INTRAVENOUS
  Filled 2023-06-03: qty 50

## 2023-06-03 MED ORDER — FENTANYL-BUPIVACAINE-NACL 0.5-0.125-0.9 MG/250ML-% EP SOLN
EPIDURAL | Status: AC
  Filled 2023-06-03: qty 250

## 2023-06-03 MED ORDER — ZOLPIDEM TARTRATE 5 MG PO TABS: 5.0000 mg | ORAL_TABLET | Freq: Every evening | ORAL | Status: DC | PRN

## 2023-06-03 MED ORDER — PENICILLIN G POT IN DEXTROSE 60000 UNIT/ML IV SOLN: 3.0000 10*6.[IU] | INTRAVENOUS | Status: DC

## 2023-06-03 MED ORDER — MISOPROSTOL 50MCG HALF TABLET
50.0000 ug | ORAL_TABLET | Freq: Once | ORAL | Status: AC
  Administered 2023-06-03: 50 ug via VAGINAL
  Filled 2023-06-03: qty 1

## 2023-06-03 MED ORDER — LIDOCAINE HCL (PF) 1 % IJ SOLN: 30.0000 mL | INTRAMUSCULAR | Status: DC | PRN

## 2023-06-03 MED ORDER — MISOPROSTOL 50MCG HALF TABLET
50.0000 ug | ORAL_TABLET | ORAL | Status: DC | PRN
  Administered 2023-06-03 (×2): 50 ug via VAGINAL
  Filled 2023-06-03 (×3): qty 1

## 2023-06-03 MED ORDER — LACTATED RINGERS IV SOLN
500.0000 mL | Freq: Once | INTRAVENOUS | Status: AC
  Administered 2023-06-03: 500 mL via INTRAVENOUS

## 2023-06-03 MED ORDER — IBUPROFEN 600 MG PO TABS
600.0000 mg | ORAL_TABLET | Freq: Four times a day (QID) | ORAL | Status: DC
  Administered 2023-06-04 (×3): 600 mg via ORAL
  Filled 2023-06-03 (×3): qty 1

## 2023-06-03 MED ORDER — AMMONIA AROMATIC IN INHA
RESPIRATORY_TRACT | Status: AC
Start: 2023-06-03 — End: 2023-06-03
  Filled 2023-06-03: qty 10

## 2023-06-03 MED ORDER — LIDOCAINE HCL (PF) 1 % IJ SOLN
INTRAMUSCULAR | Status: AC
  Filled 2023-06-03: qty 30

## 2023-06-03 MED ORDER — DOCUSATE SODIUM 100 MG PO CAPS
100.0000 mg | ORAL_CAPSULE | Freq: Two times a day (BID) | ORAL | Status: DC
  Administered 2023-06-03 – 2023-06-04 (×2): 100 mg via ORAL
  Filled 2023-06-03 (×2): qty 1

## 2023-06-03 MED ORDER — EPHEDRINE 5 MG/ML INJ: 10.0000 mg | INTRAVENOUS | Status: DC | PRN

## 2023-06-03 MED ORDER — PHENYLEPHRINE 80 MCG/ML (10ML) SYRINGE FOR IV PUSH (FOR BLOOD PRESSURE SUPPORT): 80.0000 ug | PREFILLED_SYRINGE | INTRAVENOUS | Status: DC | PRN

## 2023-06-03 MED ORDER — PRENATAL MULTIVITAMIN CH
1.0000 | ORAL_TABLET | Freq: Every day | ORAL | Status: DC
  Filled 2023-06-03: qty 1

## 2023-06-03 MED ORDER — ACETAMINOPHEN 325 MG PO TABS: 650.0000 mg | ORAL_TABLET | ORAL | Status: DC | PRN

## 2023-06-03 MED ORDER — MISOPROSTOL 200 MCG PO TABS
ORAL_TABLET | ORAL | Status: AC
  Filled 2023-06-03: qty 4

## 2023-06-03 MED ORDER — WITCH HAZEL-GLYCERIN EX PADS
1.0000 | MEDICATED_PAD | CUTANEOUS | Status: DC | PRN
  Filled 2023-06-03: qty 100

## 2023-06-03 MED ORDER — LIDOCAINE HCL (PF) 1 % IJ SOLN
INTRAMUSCULAR | Status: DC | PRN
  Administered 2023-06-03: 5 mL

## 2023-06-03 MED ORDER — LACTATED RINGERS IV SOLN: INTRAVENOUS | Status: DC

## 2023-06-03 MED ORDER — OXYTOCIN-SODIUM CHLORIDE 30-0.9 UT/500ML-% IV SOLN: 2.5000 [IU]/h | INTRAVENOUS | Status: DC

## 2023-06-03 MED ORDER — ONDANSETRON HCL 4 MG PO TABS: 4.0000 mg | ORAL_TABLET | ORAL | Status: DC | PRN

## 2023-06-03 MED ORDER — LIDOCAINE-EPINEPHRINE (PF) 1.5 %-1:200000 IJ SOLN
INTRAMUSCULAR | Status: DC | PRN
  Administered 2023-06-03: 3 mL via PERINEURAL

## 2023-06-03 MED ORDER — OXYTOCIN-SODIUM CHLORIDE 30-0.9 UT/500ML-% IV SOLN
1.0000 m[IU]/min | INTRAVENOUS | Status: DC
  Filled 2023-06-03: qty 500

## 2023-06-03 MED ORDER — FENTANYL-BUPIVACAINE-NACL 0.5-0.125-0.9 MG/250ML-% EP SOLN
12.0000 mL/h | EPIDURAL | Status: DC | PRN
  Administered 2023-06-03: 12 mL/h via EPIDURAL

## 2023-06-03 MED ORDER — FENTANYL CITRATE (PF) 100 MCG/2ML IJ SOLN: 50.0000 ug | INTRAMUSCULAR | Status: DC | PRN

## 2023-06-03 MED ORDER — OXYTOCIN 10 UNIT/ML IJ SOLN
INTRAMUSCULAR | Status: AC
  Filled 2023-06-03: qty 2

## 2023-06-03 MED ORDER — DIPHENHYDRAMINE HCL 25 MG PO CAPS: 25.0000 mg | ORAL_CAPSULE | Freq: Four times a day (QID) | ORAL | Status: DC | PRN

## 2023-06-03 MED ORDER — BENZOCAINE-MENTHOL 20-0.5 % EX AERO
1.0000 | INHALATION_SPRAY | CUTANEOUS | Status: DC | PRN
  Filled 2023-06-03: qty 56

## 2023-06-03 MED ORDER — SIMETHICONE 80 MG PO CHEW: 80.0000 mg | CHEWABLE_TABLET | ORAL | Status: DC | PRN

## 2023-06-03 MED ORDER — ACETAMINOPHEN 500 MG PO TABS
1000.0000 mg | ORAL_TABLET | Freq: Four times a day (QID) | ORAL | Status: DC
  Administered 2023-06-04 (×3): 1000 mg via ORAL
  Filled 2023-06-03 (×3): qty 2

## 2023-06-03 MED ORDER — HYDROXYZINE HCL 25 MG PO TABS: 50.0000 mg | ORAL_TABLET | Freq: Four times a day (QID) | ORAL | Status: DC | PRN

## 2023-06-03 MED ORDER — ONDANSETRON HCL 4 MG/2ML IJ SOLN: 4.0000 mg | Freq: Four times a day (QID) | INTRAMUSCULAR | Status: DC | PRN

## 2023-06-03 MED ORDER — DIPHENHYDRAMINE HCL 50 MG/ML IJ SOLN: 12.5000 mg | INTRAMUSCULAR | Status: DC | PRN

## 2023-06-03 MED ORDER — BUPIVACAINE HCL (PF) 0.25 % IJ SOLN
INTRAMUSCULAR | Status: DC | PRN
  Administered 2023-06-03: 3 mL via EPIDURAL
  Administered 2023-06-03: 2 mL via EPIDURAL

## 2023-06-03 MED ORDER — COCONUT OIL OIL: 1.0000 | TOPICAL_OIL | Status: DC | PRN

## 2023-06-03 MED ORDER — SODIUM CHLORIDE 0.9 % IV SOLN
5.0000 10*6.[IU] | Freq: Once | INTRAVENOUS | Status: AC
  Administered 2023-06-03: 5 10*6.[IU] via INTRAVENOUS
  Filled 2023-06-03: qty 5

## 2023-06-03 MED ORDER — MISOPROSTOL 25 MCG QUARTER TABLET
25.0000 ug | ORAL_TABLET | Freq: Once | ORAL | Status: AC
  Administered 2023-06-03: 25 ug via ORAL
  Filled 2023-06-03: qty 1

## 2023-06-03 MED ORDER — SOD CITRATE-CITRIC ACID 500-334 MG/5ML PO SOLN: 30.0000 mL | ORAL | Status: DC | PRN

## 2023-06-03 NOTE — Progress Notes (Signed)
 Megan Sullivan is a 38 y.o. [id number] at [redacted]w[redacted]d by LMP admitted for induction of labor due to BMI.  Subjective: Comfortable with epidural. Discussed tracing with pt, FSE recommended, accepted.   Objective: BP (!) 128/91 (BP Location: Right Arm)   Pulse 95   Temp 98.3 F (36.8 C) (Oral)   Resp 16   Ht 5\' 5"  (1.651 m)   Wt 128.8 kg   LMP 08/28/2022 (Exact Date)   BMI 47.26 kg/m  No intake/output data recorded. No intake/output data recorded.  FHT:  FHR: 140 bpm, variability: moderate,  accelerations:  Present,  decelerations:  Present early and variable UC:   regular, every 2-4.5 minutes SVE:   Dilation: 8 Effacement (%): 80 Station: 0 Exam by:: L Wray Goehring, CNM  Labs: Lab Results  Component Value Date   WBC 12.1 (H) 06/03/2023   HGB 13.2 06/03/2023   HCT 38.1 06/03/2023   MCV 81.1 06/03/2023   PLT 149 (L) 06/03/2023    Assessment / Plan: IOL secondary to BMI progressing well   Labor: Progressing normally FSE placed  Fetal Wellbeing:  Category II Pain Control:  Epidural I/D:   GBS positive, membranes uncertain, no fluid noted with FSE placement Anticipated MOD:  NSVD  Dr Alvia Awkward updated.   Berkley Breech Britini Garcilazo, CNM 06/03/2023, 2:37 PM

## 2023-06-03 NOTE — Anesthesia Preprocedure Evaluation (Addendum)
 Anesthesia Evaluation  Patient identified by MRN, date of birth, ID band Patient awake    Reviewed: Allergy & Precautions, H&P , NPO status , Patient's Chart, lab work & pertinent test results, reviewed documented beta blocker date and time   Airway Mallampati: II  TM Distance: >3 FB Neck ROM: full    Dental no notable dental hx.    Pulmonary asthma  Childhood asthma    Pulmonary exam normal breath sounds clear to auscultation       Cardiovascular Exercise Tolerance: Good negative cardio ROS  Rhythm:regular Rate:Normal     Neuro/Psych  Headaches  negative psych ROS   GI/Hepatic negative GI ROS, Neg liver ROS,,,  Endo/Other  negative endocrine ROS    Renal/GU negative Renal ROS  negative genitourinary   Musculoskeletal   Abdominal   Peds  Hematology negative hematology ROS (+)   Anesthesia Other Findings   Reproductive/Obstetrics negative OB ROS                             Anesthesia Physical Anesthesia Plan  ASA: 3  Anesthesia Plan: Epidural   Post-op Pain Management:    Induction:   PONV Risk Score and Plan:   Airway Management Planned:   Additional Equipment:   Intra-op Plan:   Post-operative Plan:   Informed Consent: I have reviewed the patients History and Physical, chart, labs and discussed the procedure including the risks, benefits and alternatives for the proposed anesthesia with the patient or authorized representative who has indicated his/her understanding and acceptance.     Dental Advisory Given  Plan Discussed with: CRNA  Anesthesia Plan Comments:        Anesthesia Quick Evaluation

## 2023-06-03 NOTE — Lactation Note (Signed)
 This note was copied from a baby's chart. Lactation Consultation Note  Patient Name: Megan Sullivan [id number] Date: 06/03/2023 Age:36 hours Reason for consult: Initial assessment   Maternal Data Has patient been taught Hand Expression?: Yes Does the patient have breastfeeding experience prior to this delivery?: Yes How long did the patient breastfeed?: 2 years Using a my breast friend pillow. MOB with significant carpal tunnel syndrome in right hand.    Feeding Mother's Current Feeding Choice: Breast Milk  LATCH Score Latch: Too sleepy or reluctant, no latch achieved, no sucking elicited.  Audible Swallowing: None  Type of Nipple: Everted at rest and after stimulation  Comfort (Breast/Nipple): Soft / non-tender  Hold (Positioning): Assistance needed to correctly position infant at breast and maintain latch.  LATCH Score: 5   Lactation Tools Discussed/Used    Interventions Interventions: Breast feeding basics reviewed;Assisted with latch;Hand express;Position options;Support pillows;Adjust position;Education;LC Psychologist, educational;Infant Driven Feeding Algorithm education Infant sleepy and would occasionally grasp large nipple but would not sustain latch or suck. Large clear emesis and avoiding opening mouth after that. Mom to attempt feeding at 30 minute intervals until baby awakens. Reviewed goal of 8-12 feedings in 24 hours and expected output over the first week.   Discharge    Consult Status Consult Status: Follow-up Follow-up type: In-patient    Seldon Dago 06/03/2023, 11:55 PM

## 2023-06-03 NOTE — H&P (Signed)
 History and Physical   HPI  Megan Sullivan is a 38 y.o. [id number] at [redacted]w[redacted]d Estimated Date of Delivery: 06/04/23 who is being admitted for induction of labor due to elevated BMI in pregnancy.    OB History  OB History  Gravida Para Term Preterm AB Living  4 2 2  0 1 2  SAB IAB Ectopic Multiple Live Births  1 0 0 0 2    # Outcome Date GA Lbr Len/2nd Weight Sex Type Anes PTL Lv  4 Current           3 Term 08/23/21 [redacted]w[redacted]d  3969 g M Vag-Spont   LIV  2 Term 07/13/18 [redacted]w[redacted]d / 00:18 3100 g F Vag-Spont EPI  LIV     Name: HANNI, MILFORD     Apgar1: 8  Apgar5: 9  1 SAB 06/2016            PROBLEM LIST  Pregnancy complications or risks: Patient Active Problem List   Diagnosis Date Noted   Labor and delivery, indication for care 06/03/2023   [redacted] weeks gestation of pregnancy 05/25/2023   Gestational thrombocytopenia (HCC) 03/12/2023   Supervision of high risk pregnancy, antepartum 10/28/2022   Obesity affecting pregnancy in third trimester 08/23/2021   Obesity, morbid, BMI 40.0-49.9 (HCC) 06/28/2018   Sickle cell trait (HCC) 01/19/2018   Hx of migraines 03/07/2015    Prenatal labs and studies: ABO, Rh: --/--/PENDING (04/24 0130) Antibody: PENDING (04/24 0130) Rubella: 2.64 (10/17 0907) RPR: Non Reactive (01/30 1038)  HBsAg: Negative (10/17 0907)  HIV: Non Reactive (01/30 1038)  AVW:UJWJXBJY/-- (03/27 1340)   Past Medical History:  Diagnosis Date   Asthma    Migraines    Obesity      Past Surgical History:  Procedure Laterality Date   DILATION AND CURETTAGE OF UTERUS  2018   OVARIAN CYST REMOVAL  2017     Medications    Current Discharge Medication List     CONTINUE these medications which have NOT CHANGED   Details  Magnesium Oxide -Mg Supplement 500 MG TABS Take 1 tablet by mouth daily.    Prenatal MV & Min w/FA-DHA (ONE A DAY PRENATAL PO)    Associated Diagnoses: Encounter for supervision of other normal pregnancy in first trimester     Butalbital -APAP-Caffeine  50-325-40 MG capsule Take 1-2 capsules by mouth every 6 (six) hours as needed for headache. Qty: 30 capsule, Refills: 1   Associated Diagnoses: Migraine without status migrainosus, not intractable, unspecified migraine type    SUMAtriptan  (IMITREX ) 50 MG tablet Take 1 tablet (50 mg total) by mouth every 2 (two) hours as needed for migraine. Qty: 10 tablet, Refills: 6         Allergies  Patient has no known allergies.  Review of Systems  Constitutional: negative Eyes: negative Ears, nose, mouth, throat, and face: negative Respiratory: negative Cardiovascular: negative Gastrointestinal: negative Genitourinary:negative Integument/breast: negative Hematologic/lymphatic: negative Musculoskeletal:negative Neurological: negative Behavioral/Psych: negative Endocrine: negative Allergic/Immunologic: negative  Physical Exam  BP (!) 150/91 (BP Location: Left Arm)   Pulse 100   Temp 98.3 F (36.8 C) (Oral)   Resp 17   LMP 08/28/2022 (Exact Date)   Lungs:  CTA B Cardio: RRR without M/R/G Abd: Soft, gravid, NT Presentation: cephalic EXT: No C/C/ 1+ Edema DTRs: 2+ B CERVIX:    See Prenatal records for more detailed PE.     FHR:  Baseline: 130 bpm, Variability: Good {> 6 bpm), Accelerations: Reactive, and Decelerations: Absent  Toco: Uterine Contractions: irregular , mild   Test Results  Results for orders placed or performed during the hospital encounter of 06/03/23 (from the past 24 hours)  CBC     Status: Abnormal   Collection Time: 06/03/23  1:30 AM  Result Value Ref Range   WBC 12.1 (H) 4.0 - 10.5 K/uL   RBC 4.70 3.87 - 5.11 MIL/uL   Hemoglobin 13.2 12.0 - 15.0 g/dL   HCT 13.2 44.0 - 10.2 %   MCV 81.1 80.0 - 100.0 fL   MCH 28.1 26.0 - 34.0 pg   MCHC 34.6 30.0 - 36.0 g/dL   RDW 72.5 (H) 36.6 - 44.0 %   Platelets 149 (L) 150 - 400 K/uL   nRBC 0.0 0.0 - 0.2 %  Type and screen     Status: None (Preliminary result)   Collection  Time: 06/03/23  1:30 AM  Result Value Ref Range   ABO/RH(D) PENDING    Antibody Screen PENDING    Sample Expiration      06/06/2023,2359 Performed at [facility], [address], [place], [place] [address]    Group B Strep positive  Assessment   [id number] at [redacted]w[redacted]d Estimated Date of Delivery: 06/04/23  The fetus is reassuring.   Patient Active Problem List   Diagnosis Date Noted   Labor and delivery, indication for care 06/03/2023   [redacted] weeks gestation of pregnancy 05/25/2023   Gestational thrombocytopenia (HCC) 03/12/2023   Supervision of high risk pregnancy, antepartum 10/28/2022   Obesity affecting pregnancy in third trimester 08/23/2021   Obesity, morbid, BMI 40.0-49.9 (HCC) 06/28/2018   Sickle cell trait (HCC) 01/19/2018   Hx of migraines 03/07/2015    Plan  1. Admit to L&D :   2. EFM:-- Category 1 3. IV pain medication or Epidural if desired.   4. Admission labs  5. Anticipate NSVD 6. Dr. Alvia Awkward Notified of admssion  Megan Sullivan, [place]  06/03/2023 2:50 AM

## 2023-06-03 NOTE — Progress Notes (Signed)
 Megan Sullivan is a 38 y.o. Z6X0960 at [redacted]w[redacted]d by LMP admitted for induction of labor due to BMI.  Subjective: Now feeling some contractions in her lowe abdomen.   Objective: BP (!) 128/91 (BP Location: Right Arm)   Pulse 95   Temp 98.3 F (36.8 C) (Oral)   Resp 16   Ht 5\' 5"  (1.651 m)   Wt 128.8 kg   LMP 08/28/2022 (Exact Date)   BMI 47.26 kg/m  No intake/output data recorded. No intake/output data recorded.  FHT:  FHR: 140 bpm, variability: moderate,  accelerations:  Present,  decelerations:  Present early  UC:   regular, every 1-4 minutes SVE:   Dilation: 3 Effacement (%): 50 Station: Ballotable Exam by:: L Dominc,  Labs: Lab Results  Component Value Date   WBC 12.1 (H) 06/03/2023   HGB 13.2 06/03/2023   HCT 38.1 06/03/2023   MCV 81.1 06/03/2023   PLT 149 (L) 06/03/2023    Assessment / Plan: IOL d/t BMI   Labor: ripening stage, Dose #3 Cytotec  placed   Fetal Wellbeing:  Category I Pain Control:  planning epidural  I/D:  GBS positive, PCN started, membranes intact  Anticipated MOD:  NSVD  Megan Sullivan, CNM 06/03/2023, 12:25 PM

## 2023-06-03 NOTE — Progress Notes (Signed)
 Megan Sullivan is a 39 y.o. N8G9562 at [redacted]w[redacted]d by LMP admitted for induction of labor due to BMI.  Subjective: Comfortable. Just ordered a breakfast tray. Would prefer to avoid ripening balloon. Her husband is at her side.   Objective: BP (!) 132/94 (BP Location: Right Arm)   Pulse 92   Temp 98.2 F (36.8 C) (Oral)   Resp 16   Ht 5\' 5"  (1.651 m)   Wt 128.8 kg   LMP 08/28/2022 (Exact Date)   BMI 47.26 kg/m  No intake/output data recorded. No intake/output data recorded.  FHT:  FHR: 135 bpm, variability: moderate,  accelerations:  Present,  decelerations:  Absent UC:   regular, every 2-4 minutes SVE:   Dilation: 1 Effacement (%): 50 Station: Ballotable Exam by:: L Edwardine Deschepper, CNM  Labs: Lab Results  Component Value Date   WBC 12.1 (H) 06/03/2023   HGB 13.2 06/03/2023   HCT 38.1 06/03/2023   MCV 81.1 06/03/2023   PLT 149 (L) 06/03/2023    Assessment / Plan: IOL secondary to high BMI   Labor: ripening stage, second dose of Cytotec  placed, consider cook's catheter later Fetal Wellbeing:  Category I Pain Control:   aware of all options, will ask if desired  I/D:   GBS positive Membranes intact  Anticipated MOD:  NSVD  Megan Sullivan, CNM 06/03/2023, 8:15 AM

## 2023-06-03 NOTE — Discharge Instructions (Signed)

## 2023-06-03 NOTE — Anesthesia Procedure Notes (Signed)
 Epidural Patient location during procedure: OB Start time: 06/03/2023 1:49 PM End time: 06/03/2023 2:09 PM  Staffing Anesthesiologist: Zula Hitch, MD Resident/CRNA: Philippe Brazen, CRNA Performed: resident/CRNA   Preanesthetic Checklist Completed: patient identified, IV checked, site marked, risks and benefits discussed, surgical consent, monitors and equipment checked, pre-op evaluation and timeout performed  Epidural Patient position: sitting Prep: ChloraPrep and site prepped and draped Patient monitoring: heart rate and blood pressure Approach: midline Injection technique: LOR air  Needle:  Needle type: Tuohy  Needle gauge: 17 G Needle length: 9 cm Needle insertion depth: 9 cm Catheter at skin depth: 14 cm Test dose: negative  Assessment Events: blood not aspirated, no cerebrospinal fluid, injection not painful, no injection resistance, no paresthesia and negative IV test  Additional Notes Patient tolerated procedure well without issues.Reason for block:procedure for pain

## 2023-06-03 NOTE — Discharge Summary (Signed)
 Postpartum Discharge Summary  Date of Service updated***     Patient Name: Megan Sullivan DOB: March 27, 1985 MRN: [id number]  Date of admission: 06/03/2023 Delivery date:06/03/2023 Delivering provider: Anice Kerbs MARIE Date of discharge: 06/03/2023  Admitting diagnosis: Labor and delivery, indication for care [O75.9] Intrauterine pregnancy: [redacted]w[redacted]d     Secondary diagnosis:  Active Problems:   Labor and delivery, indication for care  Additional problems: GBS positive     Discharge diagnosis: Term Pregnancy Delivered                                              Post partum procedures:{Postpartum procedures:23558} Augmentation: Cytotec  Complications: None  Hospital course: Induction of Labor With Vaginal Delivery   38 y.o. yo O1H0865 at [redacted]w[redacted]d was admitted to the hospital 06/03/2023 for induction of labor.  Indication for induction:  BMI  .  Patient had an labor course complicated by NA Membrane Rupture Time/Date:  ,   Delivery Method:Vaginal, Spontaneous Operative Delivery:N/A Episiotomy: NA Lacerations:  intact  Details of delivery can be found in separate delivery note.  Patient had a postpartum course complicated by***. Patient is discharged home 06/03/23.  Newborn Data: Birth date:06/03/2023 Birth time:4:01 PM Gender:Female Living status:  Apgars:8 ,9  Weight:   Magnesium Sulfate received: No BMZ received: No Rhophylac:N/A MMR:N/A T-DaP:Given prenatally Flu: Yes RSV Vaccine received: No Transfusion:{Transfusion received:30440034} Immunizations administered: Immunization History  Administered Date(s) Administered   Influenza, Seasonal, Injecte, Preservative Fre 11/26/2022   Influenza,inj,Quad PF,6+ Mos 09/27/2017, 02/12/2021   Influenza-Unspecified 10/11/2019   Moderna Sars-Covid-2 Vaccination 04/16/2019   Tdap 04/14/2018, 05/30/2021, 03/11/2023    Physical exam  Vitals:   06/03/23 0300 06/03/23 0450 06/03/23 0710 06/03/23 1224  BP:  132/84 (!) 132/94 (!)  128/91  Pulse:  (!) 103 92 95  Resp:   16 16  Temp:  98 F (36.7 C) 98.2 F (36.8 C) 98.3 F (36.8 C)  TempSrc:  Oral Oral Oral  Weight: 128.8 kg     Height: 5\' 5"  (1.651 m)      General: {Exam; general:21111117} Lochia: {Desc; appropriate/inappropriate:30686::"appropriate"} Uterine Fundus: {Desc; firm/soft:30687} Incision: {Exam; incision:21111123} DVT Evaluation: {Exam; dvt:2111122} Labs: Lab Results  Component Value Date   WBC 12.1 (H) 06/03/2023   HGB 13.2 06/03/2023   HCT 38.1 06/03/2023   MCV 81.1 06/03/2023   PLT 149 (L) 06/03/2023      Latest Ref Rng & Units 02/12/2021   11:21 AM  CMP  Glucose 70 - 99 mg/dL 89   BUN 6 - 20 mg/dL 8   Creatinine 7.84 - 6.96 mg/dL 2.95   Sodium 284 - 132 mmol/L 138   Potassium 3.5 - 5.2 mmol/L 3.9   Chloride 96 - 106 mmol/L 103   CO2 20 - 29 mmol/L 22   Calcium  8.7 - 10.2 mg/dL 9.0   Total Protein 6.0 - 8.5 g/dL 6.7   Total Bilirubin 0.0 - 1.2 mg/dL 0.5   Alkaline Phos 44 - 121 IU/L 43   AST 0 - 40 IU/L 15   ALT 0 - 32 IU/L 11    Edinburgh Score:    09/30/2021   10:14 AM  Edinburgh Postnatal Depression Scale Screening Tool  I have been able to laugh and see the funny side of things. 0  I have looked forward with enjoyment to things. 0  I have blamed myself unnecessarily  when things went wrong. 0  I have been anxious or worried for no good reason. 0  I have felt scared or panicky for no good reason. 0  Things have been getting on top of me. 1  I have been so unhappy that I have had difficulty sleeping. 0  I have felt sad or miserable. 0  I have been so unhappy that I have been crying. 0  The thought of harming myself has occurred to me. 0  Edinburgh Postnatal Depression Scale Total 1      After visit meds:  Allergies as of 06/03/2023   No Known Allergies   Med Rec must be completed prior to using this [facility]***        Discharge home in stable condition Infant Feeding: {Baby feeding:23562} Infant  Disposition:{CHL IP OB HOME WITH ZOXWRU:04540} Discharge instruction: per After Visit Summary and Postpartum booklet. Activity: Advance as tolerated. Pelvic rest for 6 weeks.  Diet: {OB diet:21111121} Anticipated Birth Control: {Birth Control:23956} Postpartum Appointment:{Outpatient follow up:23559} Additional Postpartum F/U: {PP Procedure:23957} Future Appointments:No future appointments. Follow up Visit:  Follow-up Information     Dominic, Alva Jewels, CNM Follow up in 2 week(s).   Specialty: Obstetrics and Gynecology Why: 2wk virtul and 6wk PP in person Contact information: [address] [place] [address] [phone]                     06/03/2023 Berkley Breech DOMINIC, CNM

## 2023-06-04 LAB — CBC
HCT: 34.4 % — ABNORMAL LOW (ref 36.0–46.0)
Hemoglobin: 11.7 g/dL — ABNORMAL LOW (ref 12.0–15.0)
MCH: 27.7 pg (ref 26.0–34.0)
MCHC: 34 g/dL (ref 30.0–36.0)
MCV: 81.3 fL (ref 80.0–100.0)
Platelets: 142 10*3/uL — ABNORMAL LOW (ref 150–400)
RBC: 4.23 MIL/uL (ref 3.87–5.11)
RDW: 16.2 % — ABNORMAL HIGH (ref 11.5–15.5)
WBC: 13.6 10*3/uL — ABNORMAL HIGH (ref 4.0–10.5)
nRBC: 0 % (ref 0.0–0.2)

## 2023-06-04 MED ORDER — IBUPROFEN 600 MG PO TABS: 600.0000 mg | ORAL_TABLET | Freq: Four times a day (QID) | ORAL | Status: DC

## 2023-06-04 MED ORDER — ACETAMINOPHEN 500 MG PO TABS
1000.0000 mg | ORAL_TABLET | Freq: Four times a day (QID) | ORAL | Status: DC
Start: 2023-06-04 — End: 2023-07-16

## 2023-06-04 MED ORDER — BENZOCAINE-MENTHOL 20-0.5 % EX AERO: 1.0000 | INHALATION_SPRAY | CUTANEOUS | Status: DC | PRN

## 2023-06-04 MED ORDER — DOCUSATE SODIUM 100 MG PO CAPS: 100.0000 mg | ORAL_CAPSULE | Freq: Two times a day (BID) | ORAL | Status: DC | PRN

## 2023-06-04 MED ORDER — DIBUCAINE (PERIANAL) 1 % EX OINT
1.0000 | TOPICAL_OINTMENT | CUTANEOUS | Status: DC | PRN
Start: 2023-06-04 — End: 2023-07-16

## 2023-06-04 MED ORDER — WITCH HAZEL-GLYCERIN EX PADS: 1.0000 | MEDICATED_PAD | CUTANEOUS | Status: DC | PRN

## 2023-06-04 MED ORDER — COCONUT OIL OIL: 1.0000 | TOPICAL_OIL | Status: DC | PRN

## 2023-06-04 NOTE — Lactation Note (Signed)
 This note was copied from a baby's chart. Lactation Consultation Note  Patient Name: Megan Sullivan Date: 06/04/2023 Age:36 hours Reason for consult: Other (Comment);Maternal discharge (Discharge Education)   Maternal Data   Feeding Mother's Current Feeding Choice: Breast Milk  Interventions Interventions: Education;CDC milk storage guidelines  Discharge Discharge Education: Engorgement and breast care;Outpatient recommendation  Education on engorgement prevention/treatment was discussed as well as breastmilk storage guidelines.  LC provided patient with a handout on breastmilk storage guidelines from [facility]. [facility] outpatient lactation services phone number written on the white board in the room.  Patient verbalized understanding.    Consult Status Consult Status: Complete Follow-up type: Call as needed    Aaryanna Hyden S Brecken Walth 06/04/2023, 2:51 PM

## 2023-06-04 NOTE — Progress Notes (Signed)
 Patient d/c home with infant. D/c instructions, Rx, and f/u appt given to and reviewed with pt. Pt verbalized understanding. Escorted out by staff.

## 2023-06-04 NOTE — Lactation Note (Signed)
 This note was copied from a baby's chart. Lactation Consultation Note  Patient Name: Megan Sullivan IONGE'X Date: 06/04/2023 Age:36 hours Reason for consult: Follow-up assessment;Term   Maternal Data Follow up assessment w/ a 16hr old baby Megan and patient.  Patient stated that infant is very spitty but she did notice that when he does spit up he seems to feel better.  She expressed that she is still attempting to put him to the breast.  Patient verbalized that she nursed her daughter for 2 yrs but does not have any questions at the current time.  Feeding Mother's Current Feeding Choice: Breast Milk  Interventions Interventions: Education  LC reminded patient to continue to attempt to wake him up at the 3rd hour if infant is still sleep.    Lactation will return prior to patient discharging.  Consult Status Consult Status: Follow-up Follow-up type: In-patient    Lurlene Ronda S Marvella Jenning 06/04/2023, 9:57 AM

## 2023-06-04 NOTE — Anesthesia Postprocedure Evaluation (Signed)
 Anesthesia Post Note  Patient: Amayah Turpin  Procedure(s) Performed: AN AD HOC LABOR EPIDURAL  Patient location during evaluation: Mother Baby Anesthesia Type: Epidural Level of consciousness: awake and alert Pain management: pain level controlled Vital Signs Assessment: post-procedure vital signs reviewed and stable Respiratory status: spontaneous breathing, nonlabored ventilation and respiratory function stable Cardiovascular status: stable Postop Assessment: no headache, no backache, epidural receding and able to ambulate Anesthetic complications: no   No notable events documented.   Last Vitals:  Vitals:   06/04/23 0145 06/04/23 0435  BP: 116/76 (!) 143/85  Pulse: 96 93  Resp: 18 20  Temp: 36.9 C 36.7 C  SpO2: 100% 98%    Last Pain:  Vitals:   06/04/23 0435  TempSrc: Oral  PainSc:                  Wolf, Tam Savoia C

## 2023-06-11 ENCOUNTER — Encounter: Payer: Self-pay | Admitting: Licensed Practical Nurse

## 2023-06-11 ENCOUNTER — Ambulatory Visit: Admitting: Licensed Practical Nurse

## 2023-06-11 VITALS — BP 125/86 | HR 76 | Wt 271.1 lb

## 2023-06-11 DIAGNOSIS — Z0289 Encounter for other administrative examinations: Secondary | ICD-10-CM

## 2023-06-11 DIAGNOSIS — Z0130 Encounter for examination of blood pressure without abnormal findings: Secondary | ICD-10-CM

## 2023-06-11 NOTE — Progress Notes (Signed)
 Pcp, No   Chief Complaint  Patient presents with   Postpartum Care    Blood pressure check     HPI:      Megan Sullivan is a 38 y.o. B2W4132 at 1 week PP  presents today for BP check. She had  SVB on 4/22, she was noted to have a few elevated BP's PP.  Pt denies any HA's, visual disturbances or RUQ pain. She does have significant ankle swelling.  She is getting ok sleep, but it is disturbed d.t infant needs.  Her husband works from home so is able to help as needed.   Her initial BP in her left arm was 140/94, repeat on the right arm is 125/86   Patient Active Problem List   Diagnosis Date Noted   Labor and delivery, indication for care 06/03/2023   [redacted] weeks gestation of pregnancy 05/25/2023   Gestational thrombocytopenia (HCC) 03/12/2023   Supervision of high risk pregnancy, antepartum 10/28/2022   Obesity affecting pregnancy in third trimester 08/23/2021   Obesity, morbid, BMI 40.0-49.9 (HCC) 06/28/2018   Sickle cell trait (HCC) 01/19/2018   Hx of migraines 03/07/2015    Past Surgical History:  Procedure Laterality Date   DILATION AND CURETTAGE OF UTERUS  2018   OVARIAN CYST REMOVAL  2017    Family History  Problem Relation Age of Onset   Hyperlipidemia Father    Diabetes Maternal Grandmother    Healthy Mother    Breast cancer Neg Hx    Ovarian cancer Neg Hx    Colon cancer Neg Hx     Social History   Socioeconomic History   Marital status: Married    Spouse name: Not on file   Number of children: Not on file   Years of education: Not on file   Highest education level: Not on file  Occupational History   Not on file  Tobacco Use   Smoking status: Never   Smokeless tobacco: Never  Vaping Use   Vaping status: Never Used  Substance and Sexual Activity   Alcohol use: Never   Drug use: Never   Sexual activity: Yes    Birth control/protection: None  Other Topics Concern   Not on file  Social History Narrative   Not on file   Social Drivers of  Health   Financial Resource Strain: Not on file  Food Insecurity: No Food Insecurity (06/03/2023)   Hunger Vital Sign    Worried About Running Out of Food in the Last Year: Never true    Ran Out of Food in the Last Year: Never true  Transportation Needs: No Transportation Needs (06/03/2023)   PRAPARE - Administrator, Civil Service (Medical): No    Lack of Transportation (Non-Medical): No  Physical Activity: Inactive (11/16/2017)   Exercise Vital Sign    Days of Exercise per Week: 0 days    Minutes of Exercise per Session: 0 min  Stress: No Stress Concern Present (11/16/2017)   [employer] of Occupational Health - Occupational Stress Questionnaire    Feeling of Stress : Not at all  Social Connections: Moderately Integrated (11/16/2017)   Social Connection and Isolation Panel [NHANES]    Frequency of Communication with Friends and Family: Three times a week    Frequency of Social Gatherings with Friends and Family: Once a week    Attends Religious Services: 1 to 4 times per year    Active Member of Clubs or Organizations: No  Attends Banker Meetings: Never    Marital Status: Married  Catering manager Violence: Not At Risk (06/03/2023)   Humiliation, Afraid, Rape, and Kick questionnaire    Fear of Current or Ex-Partner: No    Emotionally Abused: No    Physically Abused: No    Sexually Abused: No    Outpatient Medications Prior to Visit  Medication Sig Dispense Refill   Magnesium Oxide -Mg Supplement 500 MG TABS Take 1 tablet by mouth daily.     Prenatal MV & Min w/FA-DHA (ONE A DAY PRENATAL PO)      SUMAtriptan  (IMITREX ) 50 MG tablet Take 1 tablet (50 mg total) by mouth every 2 (two) hours as needed for migraine. 10 tablet 6   acetaminophen  (TYLENOL ) 500 MG tablet Take 2 tablets (1,000 mg total) by mouth every 6 (six) hours. (Patient not taking: Reported on 06/11/2023)     benzocaine -Menthol  (DERMOPLAST) 20-0.5 % AERO Apply 1 Application topically as  needed for irritation (perineal discomfort). (Patient not taking: Reported on 06/11/2023)     Butalbital -APAP-Caffeine  50-325-40 MG capsule Take 1-2 capsules by mouth every 6 (six) hours as needed for headache. (Patient not taking: Reported on 06/11/2023) 30 capsule 1   coconut oil OIL Apply 1 Application topically as needed. (Patient not taking: Reported on 06/11/2023)     dibucaine (NUPERCAINAL) 1 % OINT Place 1 Application rectally as needed for hemorrhoids. (Patient not taking: Reported on 06/11/2023)     docusate sodium  (COLACE) 100 MG capsule Take 1 capsule (100 mg total) by mouth 2 (two) times daily as needed for mild constipation. (Patient not taking: Reported on 06/11/2023)     ibuprofen  (ADVIL ) 600 MG tablet Take 1 tablet (600 mg total) by mouth every 6 (six) hours. (Patient not taking: Reported on 06/11/2023)     witch hazel-glycerin  (TUCKS) pad Apply 1 Application topically as needed for hemorrhoids. (Patient not taking: Reported on 06/11/2023)     No facility-administered medications prior to visit.      ROS:  Review of Systems see HPI    OBJECTIVE:   Vitals:  BP 125/86 (BP Location: Right Arm, Patient Position: Sitting, Cuff Size: Large)   Pulse 76   Wt 271 lb 1.6 oz (123 kg)   Breastfeeding Yes   BMI 45.11 kg/m   Physical Exam Constitutional:      Appearance: Normal appearance.  Cardiovascular:     Rate and Rhythm: Normal rate.  Pulmonary:     Effort: Pulmonary effort is normal.  Musculoskeletal:     Right lower leg: Edema present.     Left lower leg: Edema present.  Neurological:     Mental Status: She is alert.  Psychiatric:        Mood and Affect: Mood normal.     Results: No results found for this or any previous visit (from the past 24 hours).   Assessment/Plan: No diagnosis found.  1 week PP, Normotensive  -Discussed starting antihypertensive (based on initial reading), pt would prefer to monitor BP at home, will atop at pharmacy to pick up cuff. Will check  twice a day and call if persistently >140/90  No orders of the defined types were placed in this encounter.    Berkley Breech [facility], CNM 06/11/2023 2:34 PM

## 2023-06-16 ENCOUNTER — Telehealth (INDEPENDENT_AMBULATORY_CARE_PROVIDER_SITE_OTHER): Admitting: Licensed Practical Nurse

## 2023-06-16 DIAGNOSIS — Z1332 Encounter for screening for maternal depression: Secondary | ICD-10-CM | POA: Diagnosis not present

## 2023-06-16 DIAGNOSIS — R030 Elevated blood-pressure reading, without diagnosis of hypertension: Secondary | ICD-10-CM

## 2023-06-16 NOTE — Progress Notes (Signed)
 Virtual Visit via Video Note  I connected with Grenda Zettlemoyer on 06/16/23 at  3:15 PM EDT by a video enabled telemedicine application and verified that I am speaking with the correct person using two identifiers.  Location: Patient: home in [place], [place] Provider: office in [place], [place]   I discussed the limitations of evaluation and management by telemedicine and the availability of in person appointments. The patient expressed understanding and agreed to proceed.  History of Present Illness: SVB 4/24 with LMD, intact BB 3690grams -Complications since Birth: elevated BP in the PP time, was seen last week initial BP was elevated then repeat was WNL, was not started on medication. Was instructed to check her BP at home twice a day. She just got her cuff the readings have been 130-140's/90's and one at 155/101. Denies HA, visual disturbances or RUQ pain. Did have a HA a few days ago but it was more in her neck and shoulder and feel sit was due to sleeping in a poor position.  -Has had significant pain in her pubic bone, especially when moving from side to side or sitting for a long period of time.  -Bleeding: on the lighter side -Appetite: good  -Sleep: as expected with a newborn,  -No concern with voiding, or stooling or perineum -Breastfeeding: going well, no concerns with latch -Husband went back to work but working from home so he is able to be present in the mornings, family has been around for support  -Will return to work in 6 months  -Older children adjusting to NB, they love him and want to help out  -In regards to future pregnancies" "This might be it", considering IUD  -Mood: Overall doing well, sometimes overwhelmed managing 3 young children  Last PAP 2022   Observations/Objective: GEN: NAD  EPDS 2  Assessment and Plan: Normal exam of lactating woman Screening for maternal depression Elevated blood pressure  Symphysis pubic dysfunction  Follow Up Instructions: -may  increase physical activity -Reviewed BP's with Dr Luster Salters, Check BP this evening and tomorrow Am, if >140/90 please call be see in the clinic -reviewed comfort measures for SPD, consider PT if no improvement.    I discussed the assessment and treatment plan with the patient. The patient was provided an opportunity to ask questions and all were answered. The patient agreed with the plan and demonstrated an understanding of the instructions.   The patient was advised to call back or seek an in-person evaluation if the symptoms worsen or if the condition fails to improve as anticipated.  I provided 25 minutes of non-face-to-face time during this encounter.   Berkley Breech Jakoby Melendrez, CNM

## 2023-06-17 ENCOUNTER — Other Ambulatory Visit: Payer: Self-pay | Admitting: Licensed Practical Nurse

## 2023-06-17 ENCOUNTER — Telehealth: Payer: Self-pay

## 2023-06-17 DIAGNOSIS — R030 Elevated blood-pressure reading, without diagnosis of hypertension: Secondary | ICD-10-CM

## 2023-06-17 MED ORDER — NIFEDIPINE ER OSMOTIC RELEASE 30 MG PO TB24: 30.0000 mg | ORAL_TABLET | Freq: Every day | ORAL | 2 refills | Status: AC

## 2023-06-17 NOTE — Telephone Encounter (Signed)
 Megan Sullivan called triage about her blood pressures her reading have been before bed 132/89, this morning 143/91, sat for a few minutes 138/92. She's asking what should she do

## 2023-06-17 NOTE — Progress Notes (Signed)
 Pt reported elevated BP during virtual visit on 5/7. Today BP's mild range, reviewed with Dr Luster Salters. Will start Procardia. PT called, discussed treatment, pt will return on Tuesday for BP check. (Unable to come on Monday d/t childcare) Anice Kerbs, CNM  [facility]  06/17/23  2:22 PM

## 2023-06-22 ENCOUNTER — Ambulatory Visit: Admitting: Licensed Practical Nurse

## 2023-06-24 ENCOUNTER — Ambulatory Visit: Admitting: Licensed Practical Nurse

## 2023-06-25 ENCOUNTER — Ambulatory Visit

## 2023-06-25 VITALS — BP 116/81 | HR 86 | Ht 65.0 in | Wt 263.5 lb

## 2023-06-25 DIAGNOSIS — Z0130 Encounter for examination of blood pressure without abnormal findings: Secondary | ICD-10-CM

## 2023-06-25 DIAGNOSIS — R030 Elevated blood-pressure reading, without diagnosis of hypertension: Secondary | ICD-10-CM

## 2023-06-25 NOTE — Progress Notes (Signed)
    NURSE VISIT NOTE  Subjective:    Patient ID: Megan Sullivan, female    DOB: 09-20-1985, 38 y.o.   MRN: [id number]  HPI  Patient is a 38 y.o. [phone] female who presents for BP check per order from Anice Kerbs, [place].   Patient reports compliance with prescribed BP medications: yes Procardia  30 mgdaily Last dose of BP medication: 06/24/23 at 10 pm  BP Readings from Last 3 Encounters:  06/25/23 116/81  06/16/23 (!) 155/101  06/11/23 125/86   Pulse Readings from Last 3 Encounters:  06/25/23 86  06/11/23 76  06/04/23 88    Objective:    BP 116/81   Pulse 86   Ht 5\' 5"  (1.651 m)   Wt 263 lb 8 oz (119.5 kg)   Breastfeeding Yes   BMI 43.85 kg/m   Assessment:   1. BP check   2. Elevated blood pressure reading      Plan:   Per Dr. Anice Kerbs, CNM:  Continue current treatment regimen. Continue to monitor blood pressure at home. Report any reading >140/90 or with any associated symptoms. Return to clinic as scheduled.  Patient verbalized understanding of instructions.   Vale Garrison, CMA

## 2023-07-02 ENCOUNTER — Ambulatory Visit: Admitting: Licensed Practical Nurse

## 2023-07-16 ENCOUNTER — Ambulatory Visit (INDEPENDENT_AMBULATORY_CARE_PROVIDER_SITE_OTHER): Admitting: Licensed Practical Nurse

## 2023-07-16 ENCOUNTER — Other Ambulatory Visit (HOSPITAL_COMMUNITY)
Admission: RE | Admit: 2023-07-16 | Discharge: 2023-07-16 | Disposition: A | Discharge status: Home / Self Care | Source: Ambulatory Visit | Attending: Licensed Practical Nurse | Admitting: Licensed Practical Nurse

## 2023-07-16 ENCOUNTER — Encounter: Payer: Self-pay | Admitting: Licensed Practical Nurse

## 2023-07-16 DIAGNOSIS — Z124 Encounter for screening for malignant neoplasm of cervix: Secondary | ICD-10-CM | POA: Insufficient documentation

## 2023-07-16 DIAGNOSIS — Z30430 Encounter for insertion of intrauterine contraceptive device: Secondary | ICD-10-CM | POA: Diagnosis not present

## 2023-07-16 DIAGNOSIS — Z1332 Encounter for screening for maternal depression: Secondary | ICD-10-CM

## 2023-07-16 MED ORDER — LEVONORGESTREL 20 MCG/DAY IU IUD
1.0000 | INTRAUTERINE_SYSTEM | Freq: Once | INTRAUTERINE | Status: AC
Start: 2023-07-16 — End: 2023-07-16
  Administered 2023-07-16: 1 via INTRAUTERINE

## 2023-07-16 NOTE — Progress Notes (Signed)
 Postpartum Visit  Chief Complaint:  Chief Complaint  Patient presents with   Postpartum Care    History of Present Illness: Patient is a 38 y.o. [id number] presents for postpartum visit.  Date of delivery: 06/03/2023 Type of delivery: Vaginal delivery - Vacuum or forceps assisted  no Episiotomy No.  Laceration: no  Pregnancy or labor problems:  gestational thrombocytopenia, GBS pos, sickle cell trait  Any problems since the delivery:  elevated blood pressure, started on Procardia  30 mg daily, checking BP at home daily. Reports BP have been within normal limits.   -sleeping about 5 to 6 hours/night - has not started exercise routine.  - reports feeling a "pop" when getting out of bed - mood has been stable, feels good.  -bleeding had slowed down for the first weeks postpartum, now feels she has had a cycle started on 6/1.  - denies abdominal pain  - continues breastfeeding with no complications.  - hasn't had sex.  - desires an IUD for contraception.  - no concerns with voiding and stooling.    Newborn Details:  SINGLETON :  1. Baby's name: Elijah . Birth weight: 3690grams Maternal Details:  Breast Feeding:  yes Post partum depression/anxiety noted:  no Edinburgh Post-Partum Depression Score:  0  Date of last PAP: 2022  normal   Past Medical History:  Diagnosis Date   Asthma    Migraines    Obesity     Past Surgical History:  Procedure Laterality Date   DILATION AND CURETTAGE OF UTERUS  2018   OVARIAN CYST REMOVAL  2017    Prior to Admission medications   Medication Sig Start Date End Date Taking? Authorizing Provider  Magnesium Oxide -Mg Supplement 500 MG TABS Take 1 tablet by mouth daily. 11/24/22  Yes [provider]  NIFEdipine  (PROCARDIA -XL/NIFEDICAL-XL) 30 MG 24 hr tablet Take 1 tablet (30 mg total) by mouth daily. Can increase to twice a day as needed for symptomatic contractions 06/17/23  Yes Mikena Masoner, Al[place] Jewels, CNM  Prenatal MV & Min w/FA-DHA (ONE  A DAY PRENATAL PO)  12/31/20  Yes [provider]  SUMAtriptan  (IMITREX ) 50 MG tablet Take 1 tablet (50 mg total) by mouth every 2 (two) hours as needed for migraine. 09/30/21  Yes Teresa Fender, MD  acetaminophen  (TYLENOL ) 500 MG tablet Take 2 tablets (1,000 mg total) by mouth every 6 (six) hours. Patient not taking: Reported on 06/11/2023 06/04/23   Forestine Igo, CNM  benzocaine -Menthol  (DERMOPLAST) 20-0.5 % AERO Apply 1 Application topically as needed for irritation (perineal discomfort). Patient not taking: Reported on 06/11/2023 06/04/23   Forestine Igo, CNM  coconut oil OIL Apply 1 Application topically as needed. Patient not taking: Reported on 06/11/2023 06/04/23   Forestine Igo, CNM  dibucaine (NUPERCAINAL) 1 % OINT Place 1 Application rectally as needed for hemorrhoids. Patient not taking: Reported on 06/11/2023 06/04/23   Forestine Igo, CNM  docusate sodium  (COLACE) 100 MG capsule Take 1 capsule (100 mg total) by mouth 2 (two) times daily as needed for mild constipation. Patient not taking: Reported on 06/11/2023 06/04/23   Forestine Igo, CNM  ibuprofen  (ADVIL ) 600 MG tablet Take 1 tablet (600 mg total) by mouth every 6 (six) hours. Patient not taking: Reported on 06/11/2023 06/04/23   Forestine Igo, CNM  witch hazel-glycerin  (TUCKS) pad Apply 1 Application topically as needed for hemorrhoids. Patient not taking: Reported on 06/11/2023 06/04/23   Forestine Igo, CNM    No Known Allergies  Social History   Socioeconomic History   Marital status: Married    Spouse name: Not on file   Number of children: Not on file   Years of education: Not on file   Highest education level: Not on file  Occupational History   Not on file  Tobacco Use   Smoking status: Never   Smokeless tobacco: Never  Vaping Use   Vaping status: Never Used  Substance and Sexual Activity   Alcohol use: Never   Drug use: Never   Sexual activity: Yes    Birth  control/protection: None  Other Topics Concern   Not on file  Social History Narrative   Not on file   Social Drivers of Health   Financial Resource Strain: Not on file  Food Insecurity: No Food Insecurity (06/03/2023)   Hunger Vital Sign    Worried About Running Out of Food in the Last Year: Never true    Ran Out of Food in the Last Year: Never true  Transportation Needs: No Transportation Needs (06/03/2023)   PRAPARE - Administrator, Civil Service (Medical): No    Lack of Transportation (Non-Medical): No  Physical Activity: Inactive (11/16/2017)   Exercise Vital Sign    Days of Exercise per Week: 0 days    Minutes of Exercise per Session: 0 min  Stress: No Stress Concern Present (11/16/2017)   [employer] of Occupational Health - Occupational Stress Questionnaire    Feeling of Stress : Not at all  Social Connections: Moderately Integrated (11/16/2017)   Social Connection and Isolation Panel [NHANES]    Frequency of Communication with Friends and Family: Three times a week    Frequency of Social Gatherings with Friends and Family: Once a week    Attends Religious Services: 1 to 4 times per year    Active Member of [employer] or Organizations: No    Attends Banker Meetings: Never    Marital Status: Married  Catering manager Violence: Not At Risk (06/03/2023)   Humiliation, Afraid, Rape, and Kick questionnaire    Fear of Current or Ex-Partner: No    Emotionally Abused: No    Physically Abused: No    Sexually Abused: No    Family History  Problem Relation Age of Onset   Hyperlipidemia Father    Diabetes Maternal Grandmother    Healthy Mother    Breast cancer Neg Hx    Ovarian cancer Neg Hx    Colon cancer Neg Hx     ROS see hpi   Physical Exam BP 122/83 (BP Location: Right Arm, Patient Position: Sitting, Cuff Size: Large)   Pulse 79   Wt 257 lb (116.6 kg)   Breastfeeding Yes   BMI 42.77 kg/m   Physical Exam Constitutional:       Appearance: Normal appearance.  Genitourinary:     Vulva normal.     Genitourinary Comments: SSE, cervix and discharge appear normal. Bimanual exam, good tone, uterus non gravid, non tender, non enlarged.   HENT:     Head: Normocephalic.     Nose: Nose normal.  Eyes:     Pupils: Pupils are equal, round, and reactive to light.  Cardiovascular:     Rate and Rhythm: Normal rate and regular rhythm.     Heart sounds: Normal heart sounds.  Pulmonary:     Effort: Pulmonary effort is normal.     Breath sounds: Normal breath sounds.  Chest:     Comments: Breast exam, no redness,  masses. Nipples appear normal, no cracks.   Abdominal:     Palpations: Abdomen is soft.     Comments: Diastasis recti- 2 finger breaths.   Musculoskeletal:        General: Normal range of motion.     Cervical back: Normal range of motion.  Neurological:     General: No focal deficit present.     Mental Status: She is alert and oriented to person, place, and time.  Psychiatric:        Mood and Affect: Mood normal.        Behavior: Behavior normal.      Female Chaperone present during breast and/or pelvic exam.     GYNECOLOGY OFFICE PROCEDURE NOTE  Sung Kiester is a 38 y.o. [id number] here for a Mirena IUD insertion. No GYN concerns.  Last pap smear was on 2022 and was normal.  The patient is currently using PP for contraception and her LMP is No LMP recorded..  The indication for her IUD is contraception/cycle control.  IUD Insertion Procedure Note Patient identified, informed consent performed, consent signed.   Discussed risks of irregular bleeding, cramping, infection, malpositioning, expulsion or uterine perforation of the IUD (1:1000 placements)  which may require further procedure such as laparoscopy.  IUD while effective at preventing pregnancy do not prevent transmission of sexually transmitted diseases and use of barrier methods for this purpose was discussed. Time out was performed.  Urine pregnancy  test negative.  Speculum placed in the vagina.  Cervix visualized.  Cleaned with Betadine x 2.  Grasped anteriorly with a single tooth tenaculum.  Uterus sounded to 9 cm. IUD placed per manufacturer's recommendations.  Strings trimmed to 3 cm. Tenaculum was removed, good hemostasis noted.  Patient tolerated procedure well.   Patient was given post-procedure instructions.  She was advised to have backup contraception for one week.  Patient was also asked to check IUD strings periodically and follow up in 6 weeks for IUD check.   Anice Kerbs, CNM   [facility]  Assessment: 38 y.o. [phone] presenting for 6 week postpartum visit  Plan: Problem List Items Addressed This Visit   None Visit Diagnoses       Care and examination of lactating mother    -  Primary   Relevant Orders   Cytology - PAP     Cervical cancer screening       Relevant Orders   Cytology - PAP     Encounter for IUD insertion         Encounter for screening for maternal depression            1) Contraception Education given regarding options for contraception, including IUD placement. See note   2)  Pap - ASCCP guidelines and rational discussed.  Patient opts for 3 year  screening interval  3) Patient underwent screening for postpartum depression with No concerns noted.  4) Follow up 1 year for routine annual exam Ambar montero-diaz snm  Anice Kerbs, CNM   [facility]  07/16/23  4:22 PM

## 2023-07-21 LAB — CYTOLOGY - PAP
Comment: NEGATIVE
Diagnosis: NEGATIVE
High risk HPV: NEGATIVE

## 2023-07-26 ENCOUNTER — Encounter: Payer: Self-pay | Admitting: Licensed Practical Nurse

## 2023-07-26 ENCOUNTER — Telehealth (INDEPENDENT_AMBULATORY_CARE_PROVIDER_SITE_OTHER): Admitting: Licensed Practical Nurse

## 2023-07-26 VITALS — BP 113/76

## 2023-07-26 DIAGNOSIS — Z7689 Persons encountering health services in other specified circumstances: Secondary | ICD-10-CM

## 2023-07-26 DIAGNOSIS — Z0130 Encounter for examination of blood pressure without abnormal findings: Secondary | ICD-10-CM

## 2023-07-26 NOTE — Progress Notes (Signed)
 Virtual Visit via Video Note  I connected with Megan Sullivan on 07/26/23 at  1:15 PM EDT by a video enabled telemedicine application and verified that I am speaking with the correct person using two identifiers.  Location: Patient: home, [place], [place] Provider: office, [place], [place]   I discussed the limitations of evaluation and management by telemedicine and the availability of in person appointments. The patient expressed understanding and agreed to proceed.  History of Present Illness: Pt had SVB 4/24, she had a few isolated elevated blood pressures in labor and PP, her she had one elevated BP followed by a normal repeat at her PP BP check-she elected to monitor her blood pressure at home and not start medication, on 5/7 pt reported her home blood pressures cuff the readings have been 130-140's/90's and one at 155/101. She was started on 30mg  Procardia . At her 6wk visit on 6/6 she was found to be normotensive and instructed to stop Procardia  and check her BP at home.   Megan Sullivan is doing well today. Denies any headaches, visual changes or RUQ.  Her son recently had surgery, is managing care for him and the NB, her daughter is with family to allow Shawnya to focus on the 2 younger children. Megan Sullivan had an iUD placed at her 6wk, denies any concerns with it today. Megan Sullivan intends to develop a plan to lose weight and improve her overall health. She does not have a PCP at this time.    Observations/Objective: Hone BP: Today 113/76 Yesterday 111/77 and 114/66  GEN: NAD   Assessment and Plan: Blood pressure check  Follow Up Instructions: Normotensive Reviewed people with a history of hypertensive disorders in pregnancy are at a higher risk of developing HTN/ heart disease later in life, encouraged  a heathy lifestyle to decrease risks.   Referal to PCP made.   RTC in 6 wks if desired for IUD check otherwise 1 year for annual exam    I discussed the assessment and treatment plan with the  patient. The patient was provided an opportunity to ask questions and all were answered. The patient agreed with the plan and demonstrated an understanding of the instructions.   The patient was advised to call back or seek an in-person evaluation if the symptoms worsen or if the condition fails to improve as anticipated.  I provided 10 minutes of non-face-to-face time during this encounter.   Megan Sullivan, CNM

## 2024-02-03 IMAGING — US US MFM OB FOLLOW-UP
1 series · 12 of 28 positions shown · non-contrast
Comparison: none

[Series 1: us mfm ob follow-up · 12 of 89 slices shown]
[im 4/89]
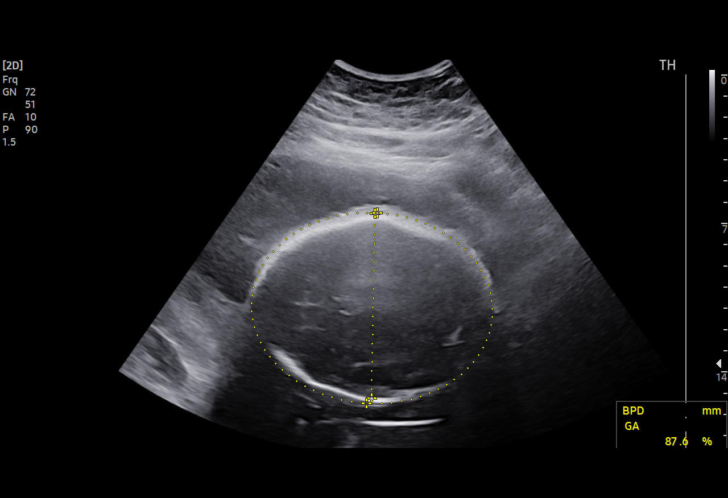
[im 10/89]
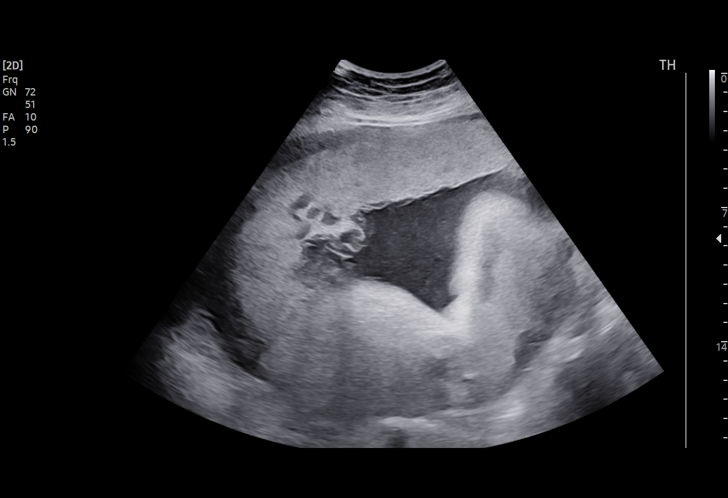
[im 17/89]
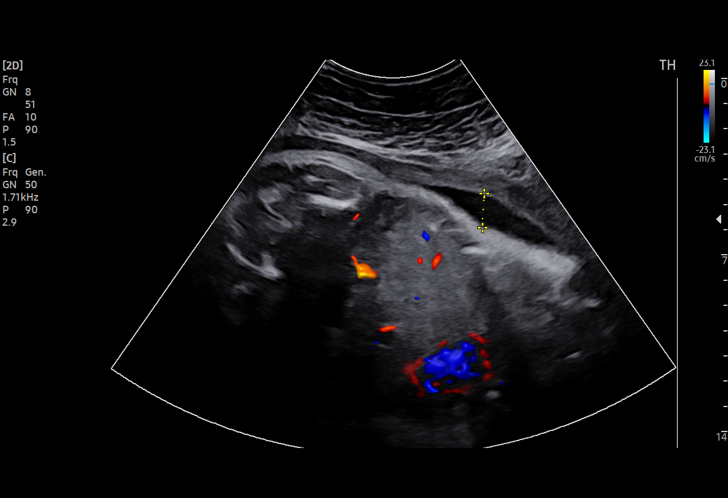
[im 27/89]
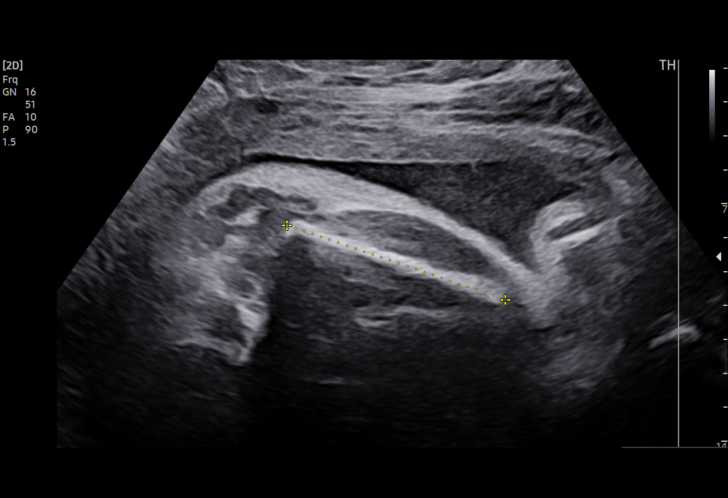
[im 33/89]
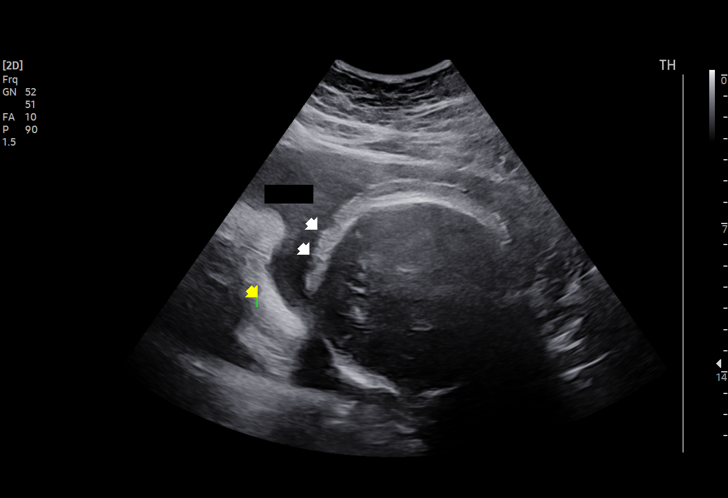
[im 40/89]
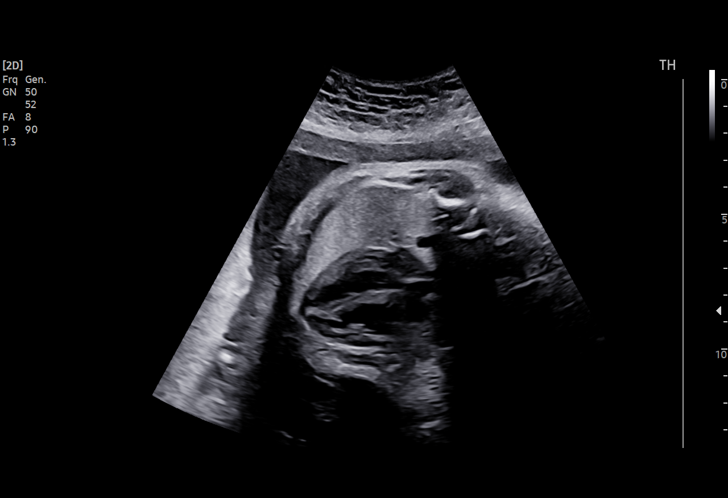
[im 49/89]
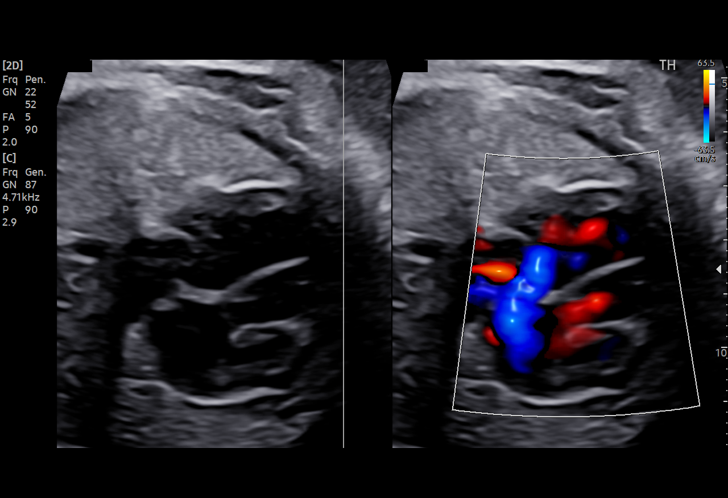
[im 56/89]
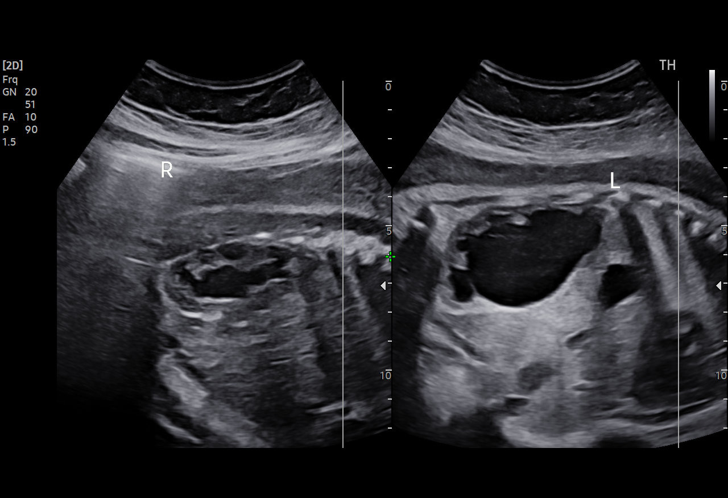
[im 62/89]
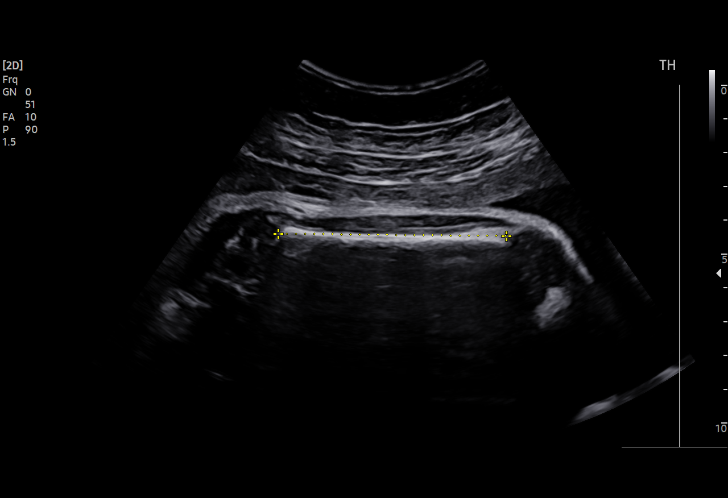
[im 72/89]
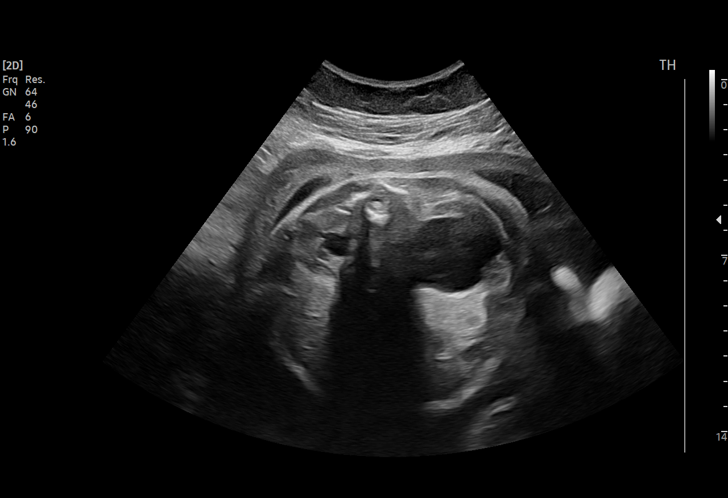
[im 79/89]
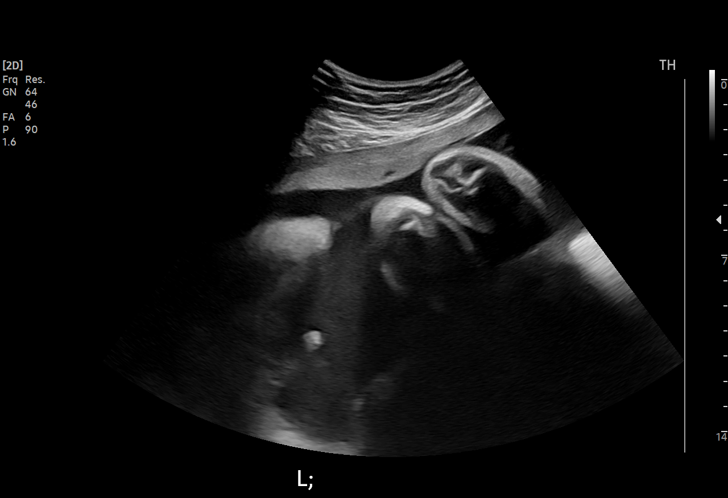
[im 85/89]
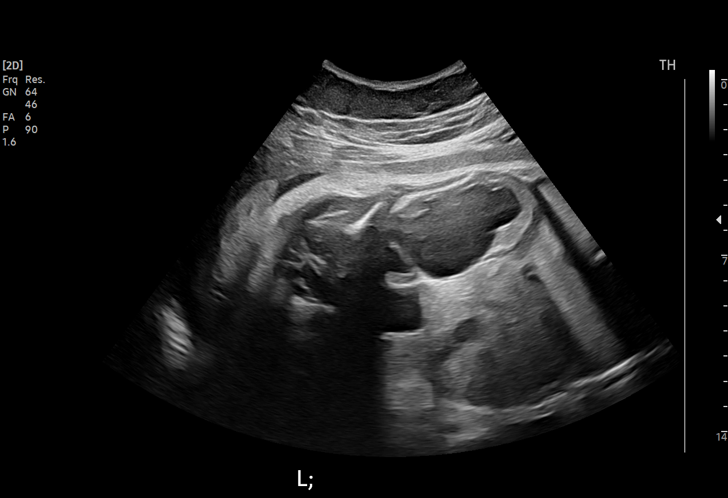

[12 of 28 positions shown; findings below may reference images not displayed]

[REDACTED]
                                                            [id number]

                                                      ADENYO

Indications

 Polyhydramnios, second trimester,
 antepartum condition or complication, fetus
 unspecified
 Obesity complicating pregnancy, third
 trimester (BMI 40)
 Abnormal fetal ultrasound (bilateral
 pyelectasis)
 33 weeks gestation of pregnancy
 Declined NIPS, AFP neg
 Genetic carrier (Sickle Cell Trait)
Vital Signs

                           Pulse:  101
 BP:          119/78
Fetal Evaluation

 Num Of Fetuses:         1
 Fetal Heart Rate(bpm):  134
 Cardiac Activity:       Observed
 Presentation:           Cephalic
 Placenta:               Posterior
 P. Cord Insertion:      Previously Visualized
 Amniotic Fluid
 AFI FV:      Polyhydramnios

 AFI Sum(cm)     %Tile       Largest Pocket(cm)
 24.6            95

 RUQ(cm)       RLQ(cm)       LUQ(cm)        LLQ(cm)

Biometry

 BPD:     86.46  mm     G. Age:  34w 6d         84  %    CI:        78.88   %    70 - 86
                                                         FL/HC:      21.7   %    19.9 -
 HC:    307.83   mm     G. Age:  34w 2d         36  %    HC/AC:      0.94        0.96 -
 AC:    327.33   mm     G. Age:  36w 5d       > 99  %    FL/BPD:     77.1   %    71 - 87
 FL:      66.65  mm     G. Age:  34w 2d         63  %    FL/AC:      20.4   %    20 - 24
 LV:        5.4  mm

 Est. FW:    5753  gm           6 lb     95  %
OB History

 Gravidity:    3
 Living:       1
Gestational Age

 LMP:           33w 3d        Date:  11/19/20                 EDD:   08/26/21
 U/S Today:     35w 0d                                        EDD:   08/15/21
 Best:          33w 3d     Det. By:  LMP  (11/19/20)          EDD:   08/26/21
Anatomy

 Cranium:               Appears normal         LVOT:                   Previously seen
 Cavum:                 Appears normal         Aortic Arch:            Not well visualized
 Ventricles:            Appears normal         Ductal Arch:            Previously seen
 Choroid Plexus:        Previously seen        Diaphragm:              Appears normal
 Cerebellum:            Previously seen        Stomach:                Appears normal, left
                                                                       sided
 Posterior Fossa:       Previously seen        Abdomen:                Appears normal
 Nuchal Fold:           Not applicable (>20    Abdominal Wall:         Previously seen
                        wks GA)
 Face:                  Orbits and profile     Cord Vessels:           Previously seen
                        previously seen
 Lips:                  Previously seen        Kidneys:                Bilateral UTD
 Palate:                Not well visualized    Bladder:                Appears normal
 Thoracic:              Appears normal         Spine:                  Previously seen
 Heart:                 Appears normal         Upper Extremities:      Previously seen
                        (4CH, axis, and
                        situs)
 RVOT:                  Previously seen        Lower Extremities:      Previously seen

 Other:  Parents do not wish to know sex of fetus. 3VV previously visualized.
         Nasal bone, lenses previously visualized. Technicallly difficult due to
         advanced GA and maternal habitus.
Cervix Uterus Adnexa
 Cervix
 Not visualized (advanced GA >56wks)

 Uterus
 No abnormality visualized.

 Right Ovary
 Within normal limits.

 Left Ovary
 Within normal limits.

 Cul De Sac
 No free fluid seen.

 Adnexa
 No abnormality visualized.
Comments

 Bruni Edmond was seen for a follow-up exam due to maternal
 obesity with a BMI of 40 and polyhydramnios noted on her
 last exam.  The fetus has also been noted to have bilateral
 hydronephrosis.
 She denies any problems since her last exam and has
 screened negative for gestational diabetes.
 The overall EFW of 6 pounds measures at the 95th
 percentile.  Polyhydramnios continues to be noted today.
 Fetal movements were noted throughout today's exam.
 Bilateral hydronephrosis continues to be noted on today's
 exam.  The right fetal kidney was 1.2 cm dilated and the left
 fetal kidney was 2.95 cm dilated.  (Normal is 0.7 cm or less)
 A normal appearing filled fetal bladder was noted today
 indicating that at least one or both of the fetal kidneys are
 functioning normally.
 The implications and management of hydronephrosis was
 discussed with the patient.
 She was advised that the renal pelvis dilatation noted on
 prenatal ultrasounds will often resolve spontaneously after
 birth.  However based on the large size of the renal pelvis
 dilatation that has been noted, her baby will most likely
 require treatment after birth.
 Processes such as an obstruction or reflux causing causing
 the significant renal pelvis dilatation was discussed.  Her
 baby will require further imaging studies such as a VCUG
 after birth to determine the cause of the significant renal
 pelvis dilatation.  A referral to pediatric urology may also be
 necessary.
 I will present her case at the next [facility] meeting to make the
 pediatricians aware of her case as the patient is most likely
 going to be delivering at [HOSPITAL].  Her baby may need to be
 placed on prophylactic antibiotics after birth.
 Due to maternal obesity, she will return in 1 week for a BPP.
 The patient stated that all of her questions have been
 answered.
 A total of 20 minutes was spent counseling and coordinating
 the care for this patient.  Greater than 50% of the time was
 spent in direct face-to-face contact.

## 2024-02-07 IMAGING — US US MFM FETAL BPP W/O NON-STRESS
1 series · 14 of 28 positions shown · non-contrast
Comparison: none

[Series 1: us mfm fetal bpp w/o non-stress · 38 acquisitions, 14 frames shown]
[im 2/38]
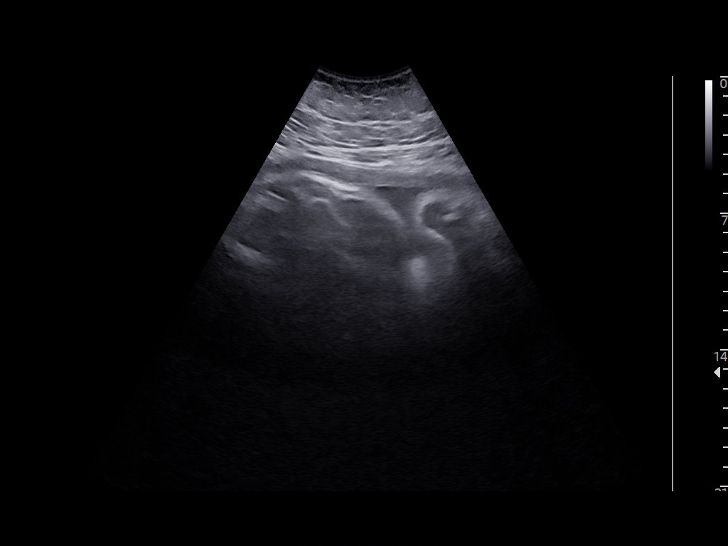
[im 5/38]
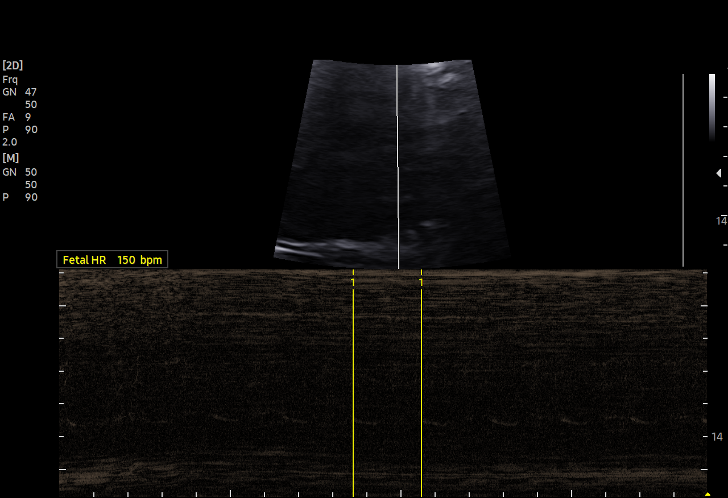
[im 7/38]
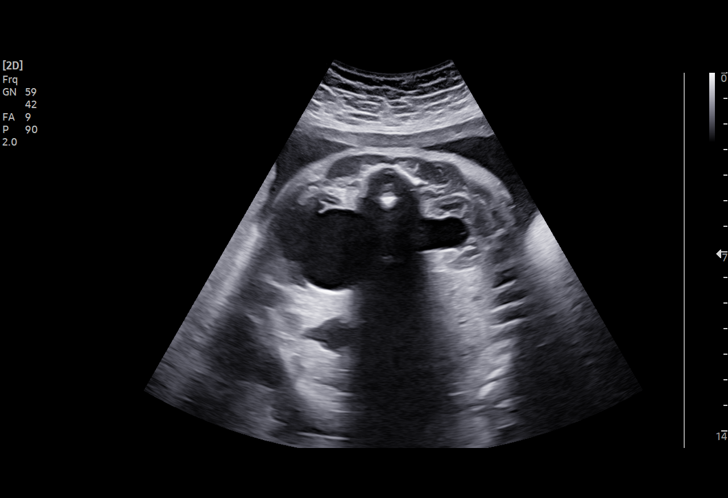
[im 10/38]
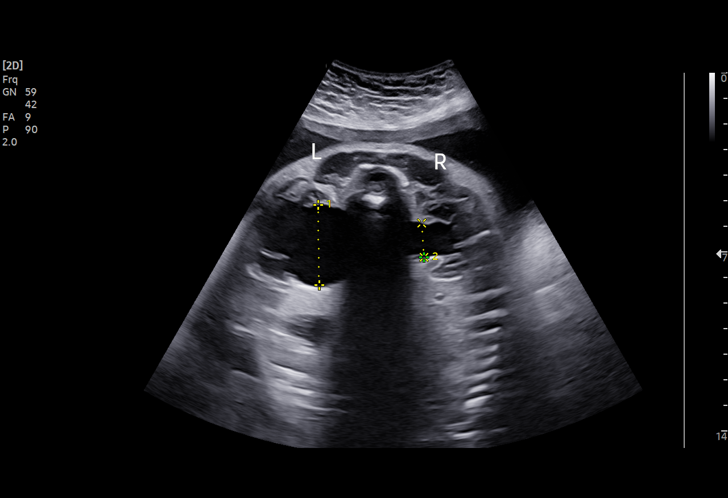
[im 13/38]
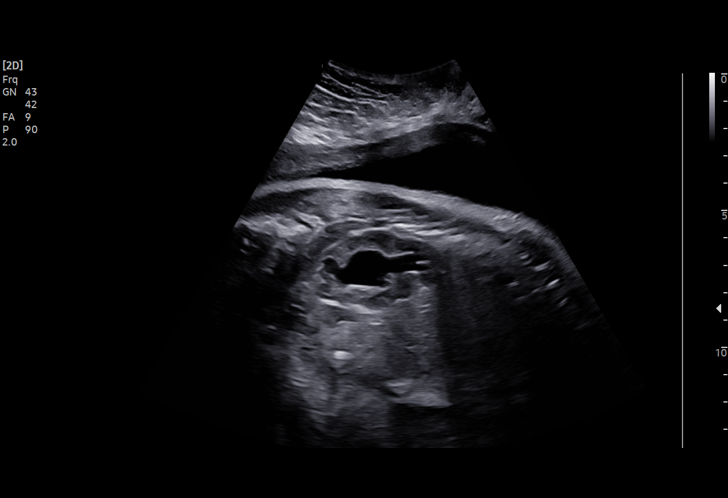
[im 16/38]
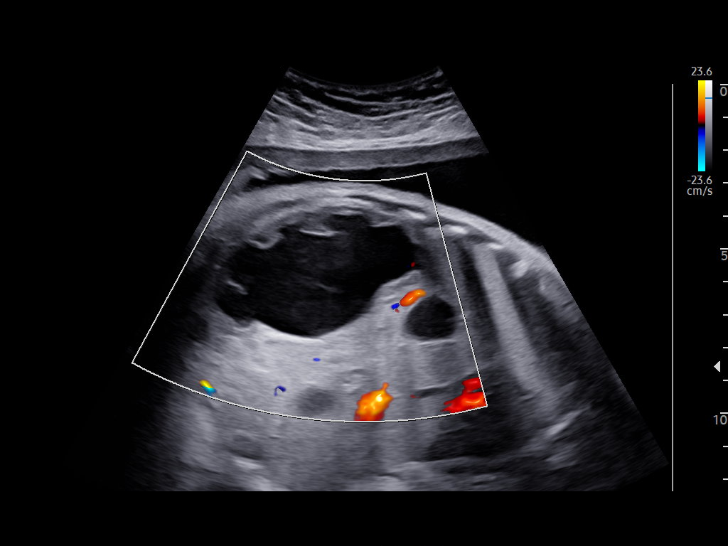
[im 18/38]
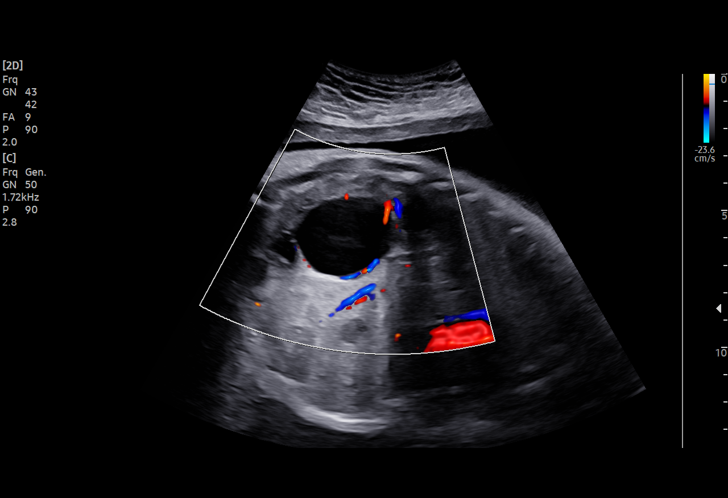
[im 21/38]
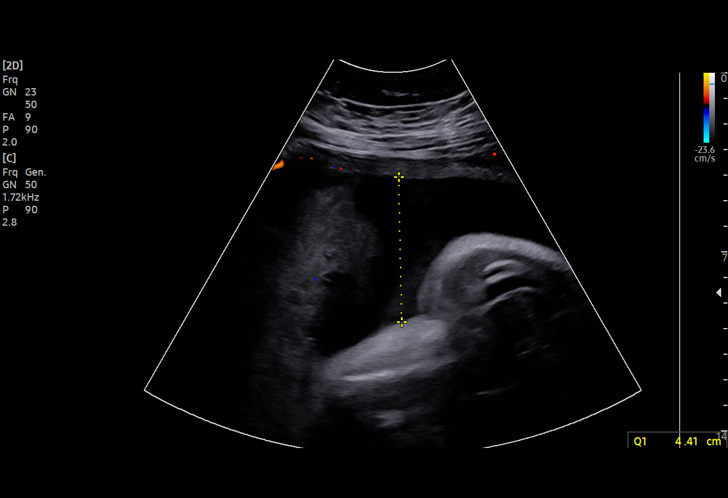
[im 24/38]
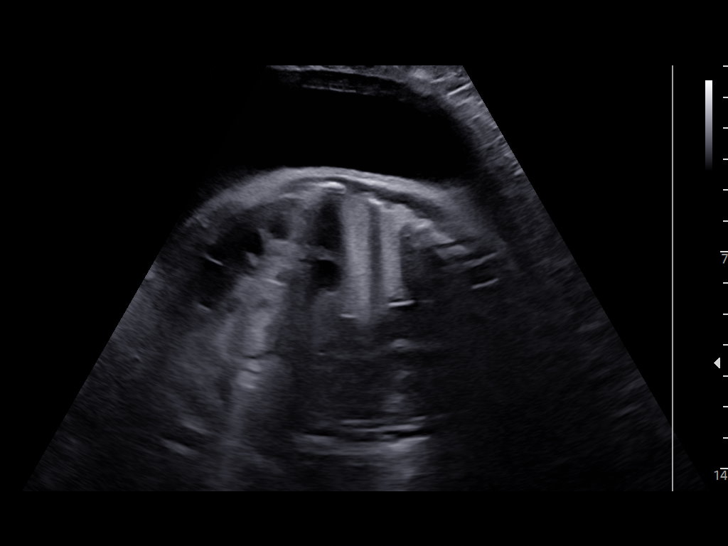
[im 27/38]
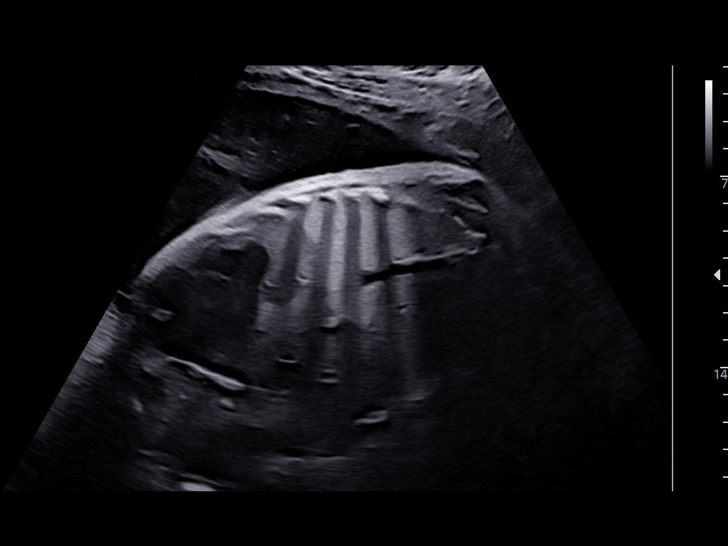
[im 29/38]
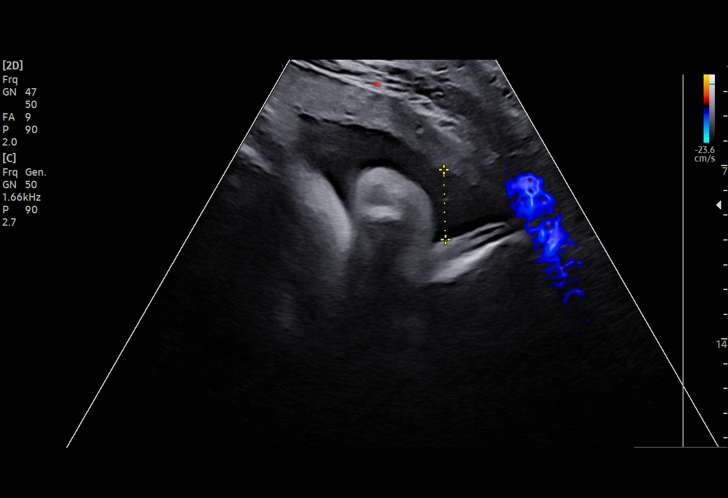
[im 32/38]
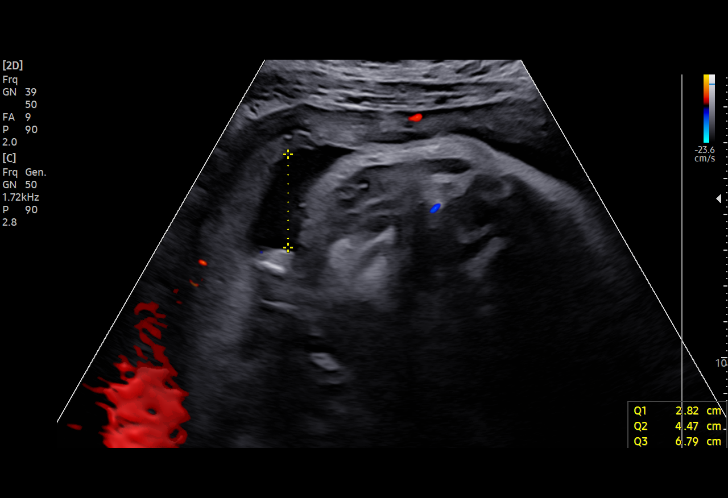
[im 35/38]
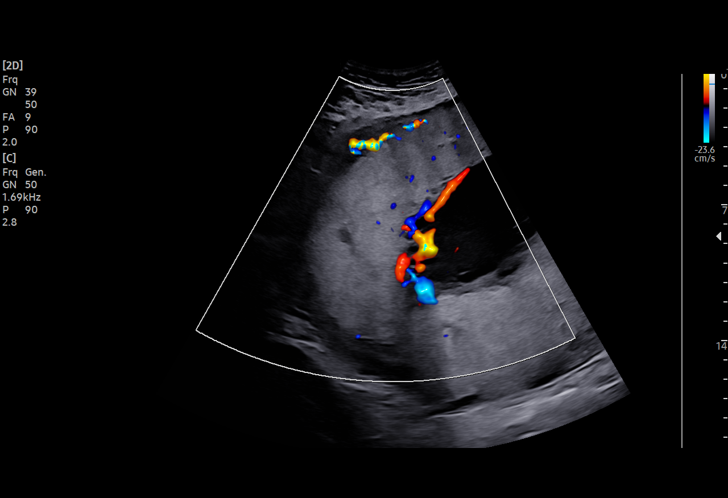
[im 38/38]
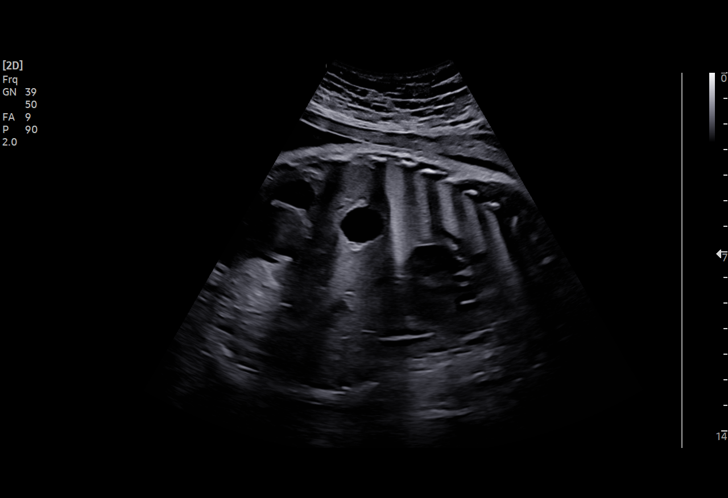

[14 of 28 positions shown; findings below may reference images not displayed]

[REDACTED]
                                                            [id number]

Indications

 34 weeks gestation of pregnancy
 Polyhydramnios, second trimester,
 antepartum condition or complication, fetus
 unspecified
 Obesity complicating pregnancy, third
 trimester (BMI 40)
 Abnormal fetal ultrasound (bilateral
 pyelectasis)
 Declined NIPS, AFP neg
 Genetic carrier (Sickle Cell Trait)
Vital Signs

 BP:          130/80
Fetal Evaluation

 Num Of Fetuses:         1
 Fetal Heart Rate(bpm):  150
 Cardiac Activity:       Observed
 Presentation:           Cephalic
 Placenta:               Posterior
 P. Cord Insertion:      Previously Visualized
 Amniotic Fluid
 AFI FV:      Within normal limits

 AFI Sum(cm)     %Tile       Largest Pocket(cm)
 16.07           58

 RUQ(cm)       RLQ(cm)       LUQ(cm)        LLQ(cm)

Biophysical Evaluation

 Amniotic F.V:   Within normal limits       F. Tone:        Observed
 F. Movement:    Observed                   Score:          [DATE]
 F. Breathing:   Observed
OB History

 Gravidity:    3
 Living:       1
Gestational Age

 LMP:           34w 0d        Date:  11/19/20                 EDD:   08/26/21
 Best:          34w 0d     Det. By:  LMP  (11/19/20)          EDD:   08/26/21
Impression

 Antenatal testing performed given maternal elevated BMI
 The biophysical profile was [DATE] with good fetal movement and
 amniotic fluid volume.

 Persistent bilateral renal hydronephrosis.
Recommendations

 Continue weekly testing. If testing can be performed at your
 offices please cancel future visits at our offices.
 Postnatal evaluation of the fetal kidneys.
